# Patient Record
Sex: Male | Born: 1965
Health system: Southern US, Community
[De-identification: ages and names within clinical notes are randomized; demographics above are authoritative.]

## PROBLEM LIST (undated history)

## (undated) DIAGNOSIS — M549 Dorsalgia, unspecified: Secondary | ICD-10-CM

## (undated) DIAGNOSIS — F32A Depression, unspecified: Secondary | ICD-10-CM

## (undated) DIAGNOSIS — R569 Unspecified convulsions: Secondary | ICD-10-CM

## (undated) DIAGNOSIS — F329 Major depressive disorder, single episode, unspecified: Secondary | ICD-10-CM

## (undated) DIAGNOSIS — E785 Hyperlipidemia, unspecified: Secondary | ICD-10-CM

## (undated) DIAGNOSIS — I10 Essential (primary) hypertension: Secondary | ICD-10-CM

## (undated) DIAGNOSIS — E119 Type 2 diabetes mellitus without complications: Secondary | ICD-10-CM

## (undated) DIAGNOSIS — G8929 Other chronic pain: Secondary | ICD-10-CM

## (undated) HISTORY — PX: OTHER SURGICAL HISTORY: SHX169

---

## 2004-06-04 ENCOUNTER — Other Ambulatory Visit: Admission: RE | Admit: 2004-06-04 | Discharge: 2004-06-04 | Payer: Self-pay | Admitting: General Surgery

## 2005-01-03 ENCOUNTER — Emergency Department (HOSPITAL_COMMUNITY): Admission: EM | Admit: 2005-01-03 | Discharge: 2005-01-03 | Payer: Self-pay | Admitting: Emergency Medicine

## 2006-02-07 ENCOUNTER — Emergency Department (HOSPITAL_COMMUNITY): Admission: EM | Admit: 2006-02-07 | Discharge: 2006-02-07 | Payer: Self-pay | Admitting: Emergency Medicine

## 2006-04-07 ENCOUNTER — Ambulatory Visit: Payer: Self-pay | Admitting: Orthopedic Surgery

## 2008-06-19 ENCOUNTER — Emergency Department (HOSPITAL_COMMUNITY): Admission: EM | Admit: 2008-06-19 | Discharge: 2008-06-19 | Payer: Self-pay | Admitting: Emergency Medicine

## 2013-09-13 ENCOUNTER — Encounter (HOSPITAL_COMMUNITY): Payer: Self-pay | Admitting: Emergency Medicine

## 2013-09-13 ENCOUNTER — Other Ambulatory Visit: Payer: Self-pay

## 2013-09-13 ENCOUNTER — Emergency Department (HOSPITAL_COMMUNITY)
Admission: EM | Admit: 2013-09-13 | Discharge: 2013-09-14 | Disposition: A | Discharge status: Home / Self Care | Payer: Medicaid Other | Attending: Emergency Medicine | Admitting: Emergency Medicine

## 2013-09-13 DIAGNOSIS — F411 Generalized anxiety disorder: Secondary | ICD-10-CM | POA: Insufficient documentation

## 2013-09-13 DIAGNOSIS — E869 Volume depletion, unspecified: Secondary | ICD-10-CM

## 2013-09-13 DIAGNOSIS — I10 Essential (primary) hypertension: Secondary | ICD-10-CM | POA: Insufficient documentation

## 2013-09-13 DIAGNOSIS — E162 Hypoglycemia, unspecified: Secondary | ICD-10-CM

## 2013-09-13 DIAGNOSIS — R5381 Other malaise: Secondary | ICD-10-CM | POA: Insufficient documentation

## 2013-09-13 DIAGNOSIS — R000 Tachycardia, unspecified: Secondary | ICD-10-CM | POA: Insufficient documentation

## 2013-09-13 DIAGNOSIS — Z79899 Other long term (current) drug therapy: Secondary | ICD-10-CM | POA: Insufficient documentation

## 2013-09-13 DIAGNOSIS — E1169 Type 2 diabetes mellitus with other specified complication: Secondary | ICD-10-CM | POA: Insufficient documentation

## 2013-09-13 DIAGNOSIS — E785 Hyperlipidemia, unspecified: Secondary | ICD-10-CM | POA: Insufficient documentation

## 2013-09-13 DIAGNOSIS — G8929 Other chronic pain: Secondary | ICD-10-CM | POA: Insufficient documentation

## 2013-09-13 DIAGNOSIS — R42 Dizziness and giddiness: Secondary | ICD-10-CM | POA: Insufficient documentation

## 2013-09-13 DIAGNOSIS — Z7982 Long term (current) use of aspirin: Secondary | ICD-10-CM | POA: Insufficient documentation

## 2013-09-13 HISTORY — DX: Hyperlipidemia, unspecified: E78.5

## 2013-09-13 HISTORY — DX: Other chronic pain: G89.29

## 2013-09-13 HISTORY — DX: Essential (primary) hypertension: I10

## 2013-09-13 HISTORY — DX: Type 2 diabetes mellitus without complications: E11.9

## 2013-09-13 HISTORY — DX: Dorsalgia, unspecified: M54.9

## 2013-09-13 LAB — GLUCOSE, CAPILLARY
Glucose-Capillary: 64 mg/dL — ABNORMAL LOW (ref 70–99)
Glucose-Capillary: 108 mg/dL — ABNORMAL HIGH (ref 70–99)

## 2013-09-13 MED ORDER — SODIUM CHLORIDE 0.9 % IV SOLN
Freq: Once | INTRAVENOUS | Status: AC
  Administered 2013-09-13: 23:00:00 via INTRAVENOUS

## 2013-09-13 NOTE — ED Notes (Signed)
Pt blood sugar 64, MD aware, pt alert eating peanut butter crackers and drinking juice.

## 2013-09-13 NOTE — ED Notes (Signed)
Pt glucose level at 108

## 2013-09-13 NOTE — ED Notes (Signed)
MD at the bedside  
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
## 2013-09-13 NOTE — ED Provider Notes (Signed)
CSN: [id number]     Arrival date & time 09/13/13  1845 History   First MD Initiated Contact with Patient 09/13/13 1932      This chart was scribed for Lyanne Co, MD by Arlan Organ, ED Scribe. This patient was seen in room APA06/APA06 and the patient's care was started 7:49 PM.   Chief Complaint  Patient presents with  . Hypoglycemia   The history is provided by the patient. No language interpreter was used.   HPI Comments: Dale Terrell is a 47 y.o. male with a hx of diabetes brought in by ambulance, who presents to the Emergency Department complaining of hypoglycemia that started today. Pt states he started feeling anxious which he feels led to dizziness. He states he has not eaten anything since this morning. Prior to arrival, pts blood sugar was 48. 1 amp of D50 was given, and blood sugar increased to 179 per nurses notes. Pt states at the time, he was unable to get off the ground secondary to dizziness and weakness, but currently is able to ambulate without difficulty. Pt denies any alcohol or drug use. Pt denies nausea and emesis.  Past Medical History  Diagnosis Date  . Diabetes mellitus without complication   . Chronic back pain   . Hyperlipidemia   . Hypertension    History reviewed. No pertinent past surgical history. History reviewed. No pertinent family history. History  Substance Use Topics  . Smoking status: Never Smoker   . Smokeless tobacco: Not on file  . Alcohol Use: No    Review of Systems A complete 10 system review of systems was obtained and all systems are negative except as noted in the HPI and PMH.   Allergies  Review of patient's allergies indicates no known allergies.  Home Medications   Current Outpatient Rx  Name  Route  Sig  Dispense  Refill  . aspirin EC 81 MG tablet   Oral   Take 81 mg by mouth daily.         Marland Kitchen glipiZIDE (GLUCOTROL) 5 MG tablet   Oral   Take 5 mg by mouth every morning.         Marland Kitchen HYDROcodone-acetaminophen  (NORCO/VICODIN) 5-325 MG per tablet   Oral   Take 1 tablet by mouth daily as needed. For pain         . lisinopril (PRINIVIL,ZESTRIL) 5 MG tablet   Oral   Take 5 mg by mouth daily.         . metFORMIN (GLUCOPHAGE) 500 MG tablet   Oral   Take 1,000 mg by mouth 2 (two) times daily.         . simvastatin (ZOCOR) 40 MG tablet   Oral   Take 40 mg by mouth at bedtime.           Triage Vitals: BP 105/69  Pulse 131  Temp(Src) 98.2 F (36.8 C) (Oral)  Resp 20  Ht 5\' 7"  (1.702 m)  Wt 210 lb (95.255 kg)  BMI 32.88 kg/m2  SpO2 100%  Physical Exam  Nursing note and vitals reviewed. Constitutional: He is oriented to person, place, and time. He appears well-developed and well-nourished.  HENT:  Head: Normocephalic and atraumatic.  Eyes: EOM are normal.  Neck: Normal range of motion.  Cardiovascular: Regular rhythm, normal heart sounds and intact distal pulses.   Tachy   Pulmonary/Chest: Effort normal and breath sounds normal. No respiratory distress.  Abdominal: Soft. He exhibits no distension. There is  no tenderness.  Musculoskeletal: Normal range of motion.  Neurological: He is alert and oriented to person, place, and time.  Skin: Skin is warm and dry.  Psychiatric: He has a normal mood and affect. Judgment normal.    ED Course  Procedures (including critical care time)  ECG interpretation  Date: 09/13/2013  Rate: 110  Rhythm: Sinus tachycardia  QRS Axis: normal  Intervals: normal  ST/T Wave abnormalities: normal  Conduction Disutrbances: none  Narrative Interpretation:   Old EKG Reviewed: No prior EKG available     DIAGNOSTIC STUDIES: Oxygen Saturation is 100% on RA, Normal by my interpretation.    COORDINATION OF CARE: 7:50 PM-Discussed treatment plan with pt at bedside and pt agreed to plan.      Labs Review Labs Reviewed  GLUCOSE, CAPILLARY - Abnormal; Notable for the following:    Glucose-Capillary 64 (*)    All other components within normal  limits  GLUCOSE, CAPILLARY - Abnormal; Notable for the following:    Glucose-Capillary 108 (*)    All other components within normal limits  GLUCOSE, CAPILLARY   Imaging Review No results found.    MDM   1. Hypoglycemia   2. Volume depletion    Patient presents with some hypoglycemia.  This is likely because he did not eat lunch or dinner.  His had decreased oral intake.  His tachycardia sinus tachycardia appears to be related to volume depletion.  He received IV fluids and feels much better at this time.  No indication for laboratory studies.  Patient given a solid meal here in the emergency department and feels much better.  Discharge home in good condition.  I personally performed the services described in this documentation, which was scribed in my presence. The recorded information has been reviewed and is accurate.       Lyanne Co, MD 09/13/13 [phone]

## 2013-09-14 NOTE — ED Notes (Signed)
Patient given discharge instruction, verbalized understand. IV removed, band aid applied. Patient ambulatory out of the department.  

## 2013-09-19 ENCOUNTER — Encounter (HOSPITAL_COMMUNITY): Payer: Self-pay | Admitting: Emergency Medicine

## 2013-09-19 ENCOUNTER — Emergency Department (HOSPITAL_COMMUNITY): Payer: Medicare Other

## 2013-09-19 ENCOUNTER — Observation Stay (HOSPITAL_COMMUNITY)
Admission: EM | Admit: 2013-09-19 | Discharge: 2013-09-20 | Disposition: A | Discharge status: Home / Self Care | Payer: Medicare Other | Attending: Internal Medicine | Admitting: Internal Medicine

## 2013-09-19 DIAGNOSIS — E162 Hypoglycemia, unspecified: Secondary | ICD-10-CM

## 2013-09-19 DIAGNOSIS — I10 Essential (primary) hypertension: Secondary | ICD-10-CM | POA: Insufficient documentation

## 2013-09-19 DIAGNOSIS — E1169 Type 2 diabetes mellitus with other specified complication: Principal | ICD-10-CM | POA: Insufficient documentation

## 2013-09-19 DIAGNOSIS — G9340 Encephalopathy, unspecified: Secondary | ICD-10-CM

## 2013-09-19 LAB — CBC WITH DIFFERENTIAL/PLATELET
Basophils Relative: 0 % (ref 0–1)
Eosinophils Relative: 0 % (ref 0–5)
HCT: 43.7 % (ref 39.0–52.0)
Lymphocytes Relative: 12 % (ref 12–46)
Lymphs Abs: 1.6 10*3/uL (ref 0.7–4.0)
MCV: 89.5 fL (ref 78.0–100.0)
Monocytes Absolute: 1.1 10*3/uL — ABNORMAL HIGH (ref 0.1–1.0)
Monocytes Relative: 8 % (ref 3–12)
Neutro Abs: 10.7 10*3/uL — ABNORMAL HIGH (ref 1.7–7.7)
Neutrophils Relative %: 80 % — ABNORMAL HIGH (ref 43–77)
RBC: 4.88 MIL/uL (ref 4.22–5.81)
WBC: 13.3 10*3/uL — ABNORMAL HIGH (ref 4.0–10.5)

## 2013-09-19 LAB — URINALYSIS, ROUTINE W REFLEX MICROSCOPIC
Glucose, UA: NEGATIVE mg/dL
Ketones, ur: 40 mg/dL — AB
Protein, ur: 30 mg/dL — AB

## 2013-09-19 LAB — RAPID URINE DRUG SCREEN, HOSP PERFORMED
Benzodiazepines: NOT DETECTED
Cocaine: NOT DETECTED
Tetrahydrocannabinol: NOT DETECTED

## 2013-09-19 LAB — URINE MICROSCOPIC-ADD ON

## 2013-09-19 LAB — GLUCOSE, CAPILLARY
Glucose-Capillary: 88 mg/dL (ref 70–99)
Glucose-Capillary: 76 mg/dL (ref 70–99)
Glucose-Capillary: 76 mg/dL (ref 70–99)
Glucose-Capillary: 112 mg/dL — ABNORMAL HIGH (ref 70–99)
Glucose-Capillary: 93 mg/dL (ref 70–99)
Glucose-Capillary: 90 mg/dL (ref 70–99)

## 2013-09-19 LAB — BASIC METABOLIC PANEL
CO2: 24 mEq/L (ref 19–32)
Calcium: 9.6 mg/dL (ref 8.4–10.5)
Creatinine, Ser: 0.58 mg/dL (ref 0.50–1.35)
Glucose, Bld: 89 mg/dL (ref 70–99)
Potassium: 3.7 mEq/L (ref 3.5–5.1)

## 2013-09-19 LAB — ETHANOL: Alcohol, Ethyl (B): <11 mg/dL (ref 0–11)

## 2013-09-19 MED ORDER — SODIUM CHLORIDE 0.9 % IV BOLUS (SEPSIS)
1000.0000 mL | Freq: Once | INTRAVENOUS | Status: AC
  Administered 2013-09-19: 1000 mL via INTRAVENOUS

## 2013-09-19 MED ORDER — SODIUM CHLORIDE 0.9 % IV SOLN
Freq: Once | INTRAVENOUS | Status: AC
  Administered 2013-09-19: 18:00:00 via INTRAVENOUS

## 2013-09-19 NOTE — ED Notes (Signed)
Meal tray ordered 

## 2013-09-19 NOTE — ED Notes (Signed)
Patient semi-sleep, will answer when spoken to.

## 2013-09-19 NOTE — ED Notes (Signed)
Pt still not oriented to date.  After being told it's Sunday continues to answer Friday.
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
## 2013-09-20 DIAGNOSIS — G9340 Encephalopathy, unspecified: Secondary | ICD-10-CM

## 2013-09-20 DIAGNOSIS — E162 Hypoglycemia, unspecified: Secondary | ICD-10-CM

## 2013-09-20 LAB — CBC
Platelets: 205 10*3/uL (ref 150–400)
RBC: 4.51 MIL/uL (ref 4.22–5.81)
WBC: 9.8 10*3/uL (ref 4.0–10.5)

## 2013-09-20 LAB — COMPREHENSIVE METABOLIC PANEL
ALT: 22 U/L (ref 0–53)
AST: 32 U/L (ref 0–37)
Albumin: 3.3 g/dL — ABNORMAL LOW (ref 3.5–5.2)
CO2: 27 mEq/L (ref 19–32)
Calcium: 9.1 mg/dL (ref 8.4–10.5)
Chloride: 101 mEq/L (ref 96–112)
GFR calc non Af Amer: >90 mL/min (ref >90)
Sodium: 137 mEq/L (ref 135–145)
Total Bilirubin: 0.5 mg/dL (ref 0.3–1.2)

## 2013-09-20 LAB — GLUCOSE, CAPILLARY
Glucose-Capillary: 102 mg/dL — ABNORMAL HIGH (ref 70–99)
Glucose-Capillary: 131 mg/dL — ABNORMAL HIGH (ref 70–99)
Glucose-Capillary: 149 mg/dL — ABNORMAL HIGH (ref 70–99)
Glucose-Capillary: 60 mg/dL — ABNORMAL LOW (ref 70–99)
Glucose-Capillary: 64 mg/dL — ABNORMAL LOW (ref 70–99)
Glucose-Capillary: 70 mg/dL (ref 70–99)

## 2013-09-20 LAB — TROPONIN I: Troponin I: <0.3 ng/mL (ref <0.30)

## 2013-09-20 MED ORDER — SIMVASTATIN 20 MG PO TABS: 40.0000 mg | ORAL_TABLET | Freq: Every day | ORAL | Status: DC

## 2013-09-20 MED ORDER — DEXTROSE-NACL 5-0.45 % IV SOLN
INTRAVENOUS | Status: DC
  Administered 2013-09-20: 1000 mL via INTRAVENOUS

## 2013-09-20 MED ORDER — LISINOPRIL 5 MG PO TABS
5.0000 mg | ORAL_TABLET | Freq: Every day | ORAL | Status: DC
  Administered 2013-09-20: 5 mg via ORAL
  Filled 2013-09-20: qty 1

## 2013-09-20 MED ORDER — ENOXAPARIN SODIUM 40 MG/0.4ML ~~LOC~~ SOLN: 40.0000 mg | SUBCUTANEOUS | Status: DC

## 2013-09-20 MED ORDER — ASPIRIN EC 81 MG PO TBEC
81.0000 mg | DELAYED_RELEASE_TABLET | Freq: Every day | ORAL | Status: DC
  Administered 2013-09-20: 81 mg via ORAL
  Filled 2013-09-20: qty 1

## 2013-09-20 MED ORDER — DEXTROSE 50 % IV SOLN
25.0000 mL | Freq: Once | INTRAVENOUS | Status: AC | PRN
  Administered 2013-09-20: 25 mL via INTRAVENOUS

## 2013-09-20 MED ORDER — DEXTROSE 50 % IV SOLN
INTRAVENOUS | Status: AC
  Filled 2013-09-20: qty 50

## 2013-09-20 NOTE — Progress Notes (Signed)
UR completed 

## 2013-09-20 NOTE — Discharge Summary (Signed)
Physician Discharge Summary  Dale Terrell:[id number] DOB: 1966-02-05 DOA: 09/19/2013  PCP: No primary provider on file.  Admit date: 09/19/2013 Discharge date: 09/20/2013  Time spent: 30 minutes  Recommendations for Outpatient Follow-up:  1. Please follow up on patient's blood sugars, I discontinued his oral hypoglycemics.   Discharge Diagnoses:  Active Problems:   Hypoglycemia   Discharge Condition: Stable/improved  Diet recommendation: Diabetic diet  Filed Weights   09/19/13 2100  Weight: 98.7 kg (217 lb 9.5 oz)    History of present illness:  47 year old male who was brought to the ED by the EMS after his girlfriend called ambulance. Patient became agitated and aggressive which was unusual for him, CBG was 44 en route per Ems. Patient usually takes glipizide and metformin at home. He remembered taking this medication today but did not eat food as the food was not available as per patient. In the ED patient was given D50 and his blood sugar is still continuing to drop so [facility] were called for admission for observation.  Hospital Course:  Patient is a 47 year old gentleman with a past medical history of type 2 diabetes mellitus who had been on glipizide and metformin at home. He was brought to the emergency department by EMS as his significant other noted mental status changes. He was found to have a blood sugar of 44 by EMS. Patient was administered D. 50, placed in overnight observation for monitoring of blood sugars. Oral hypoglycemics have been discontinued. His blood sugars overnight and this morning have remained stable, with most recent blood sugar of 131. I asked patient to stop metformin and glipizide, check his blood sugars 3 times a day before meals and report these values to his family physician at which point he would receive further instructions on his diabetic regimen.    Discharge Exam: Filed Vitals:   09/20/13 0654  BP: 133/73  Pulse: 104   Temp: 99.2 F (37.3 C)  Resp: 18    General: Patient states feeling better, tolerated his breakfast. He does not appear to have acute encephalopathy, awake alert oriented x3 Cardiovascular: Regular rate and rhythm normal S1-S2 Respiratory: Lungs overall clear to auscultation bilaterally no wheezing rhonchi or rales\ Abdomen: Soft nontender nondistended  Discharge Instructions  Discharge Orders   Future Orders Complete By Expires   Call MD for:  extreme fatigue  As directed    Call MD for:  persistant dizziness or light-headedness  As directed    Call MD for:  persistant nausea and vomiting  As directed    Call MD for:  temperature >100.4  As directed    Diet - low sodium heart healthy  As directed    Discharge instructions  As directed    Comments:     Please follow up with your Family Physician in 3-4 days.  Check your blood sugars three times daily prior to meals, record theses on a log book and report these blood sugar values to your Family Doctor.   Stop taking your diabetic pills.   Increase activity slowly  As directed        Medication List    STOP taking these medications       glipiZIDE 5 MG tablet  Commonly known as:  GLUCOTROL     metFORMIN 500 MG tablet  Commonly known as:  GLUCOPHAGE      TAKE these medications       aspirin EC 81 MG tablet  Take 81 mg by mouth daily.  HYDROcodone-acetaminophen 5-325 MG per tablet  Commonly known as:  NORCO/VICODIN  Take 1 tablet by mouth daily as needed. For pain     lisinopril 5 MG tablet  Commonly known as:  PRINIVIL,ZESTRIL  Take 5 mg by mouth daily.     simvastatin 40 MG tablet  Commonly known as:  ZOCOR  Take 40 mg by mouth at bedtime.       No Known Allergies    The results of significant diagnostics from this hospitalization (including imaging, microbiology, ancillary and laboratory) are listed below for reference.    Significant Diagnostic Studies: Ct Head Wo Contrast  09/19/2013    CLINICAL DATA:  Low blood sugar, altered level of consciousness  EXAM: CT HEAD WITHOUT CONTRAST  TECHNIQUE: Contiguous axial images were obtained from the base of the skull through the vertex without intravenous contrast.  COMPARISON:  None.  FINDINGS: No mass lesion. No midline shift. No acute hemorrhage or hematoma. No extra-axial fluid collections. No evidence of acute infarction. Calvarium is intact  IMPRESSION: Negative   Electronically Signed   By: Esperanza Heir M.D.   On: 09/19/2013 18:12    Microbiology: No results found for this or any previous visit (from the past 240 hour(s)).   Labs: Basic Metabolic Panel:  Recent Labs Lab 09/19/13 1500 09/20/13 0421  NA 131* 137  K 3.7 3.9  CL 94* 101  CO2 24 27  GLUCOSE 89 75  BUN 11 7  CREATININE 0.58 0.75  CALCIUM 9.6 9.1   Liver Function Tests:  Recent Labs Lab 09/20/13 0421  AST 32  ALT 22  ALKPHOS 45  BILITOT 0.5  PROT 6.8  ALBUMIN 3.3*   No results found for this basename: LIPASE, AMYLASE,  in the last 168 hours No results found for this basename: AMMONIA,  in the last 168 hours CBC:  Recent Labs Lab 09/19/13 1500 09/20/13 0421  WBC 13.3* 9.8  NEUTROABS 10.7*  --   HGB 15.2 13.9  HCT 43.7 40.8  MCV 89.5 90.5  PLT 235 205   Cardiac Enzymes:  Recent Labs Lab 09/20/13 0421 09/20/13 1008  TROPONINI <0.30 <0.30   BNP: BNP (last 3 results) No results found for this basename: PROBNP,  in the last 8760 hours CBG:  Recent Labs Lab 09/20/13 0526 09/20/13 0630 09/20/13 0725 09/20/13 0844 09/20/13 1035  GLUCAP 89 102* 138* 117* 131*       Signed:  Oluwasemilore Bahl  [facility] 09/20/2013, 12:37 PM

## 2013-09-20 NOTE — H&P (Signed)
PCP:   No primary provider on file.   Chief Complaint:  Hypoglycemia  HPI: 47 year old male who was brought to the ED by the EMS after his girlfriend called ambulance. Patient became agitated and aggressive which was unusual for him, CBG was 44 en  route per [facility]. Patient usually takes glipizide and metformin at home. He remembered taking this medication today but did not eat food as the food was not available as per patient. In the ED patient was given D50 and his blood sugar is still continuing to drop so [facility] hospitalist were called for admission for observation.  Allergies:  No Known Allergies    Past Medical History  Diagnosis Date  . Diabetes mellitus without complication   . Chronic back pain   . Hyperlipidemia   . Hypertension     History reviewed. No pertinent past surgical history.  Prior to Admission medications   Medication Sig Start Date End Date Taking? Authorizing Provider  aspirin EC 81 MG tablet Take 81 mg by mouth daily.   Yes Historical Provider, MD  glipiZIDE (GLUCOTROL) 5 MG tablet Take 5 mg by mouth every morning. 08/30/13  Yes Historical Provider, MD  HYDROcodone-acetaminophen (NORCO/VICODIN) 5-325 MG per tablet Take 1 tablet by mouth daily as needed. For pain 08/23/13  Yes Historical Provider, MD  lisinopril (PRINIVIL,ZESTRIL) 5 MG tablet Take 5 mg by mouth daily. 08/30/13  Yes Historical Provider, MD  metFORMIN (GLUCOPHAGE) 500 MG tablet Take 1,000 mg by mouth 2 (two) times daily. 08/31/13  Yes Historical Provider, MD  simvastatin (ZOCOR) 40 MG tablet Take 40 mg by mouth at bedtime. 08/30/13  Yes Historical Provider, MD    Social History:  reports that he has never smoked. He does not have any smokeless tobacco history on file. He reports that he does not drink alcohol or use illicit drugs.  History reviewed. No pertinent family history.   All the positives are listed in BOLD  Review of Systems:  HEENT: Headache, blurred vision, runny nose, sore  throat Neck: Hypothyroidism, hyperthyroidism,,lymphadenopathy Chest : Shortness of breath, history of COPD, Asthma Heart : Chest pain, history of coronary arterey disease GI:  Nausea, vomiting, diarrhea, constipation, GERD GU: Dysuria, urgency, frequency of urination, hematuria Neuro: Stroke, seizures, syncope Psych: Depression, anxiety, hallucinations   Physical Exam: Blood pressure 145/82, pulse 120, temperature 98.5 F (36.9 C), temperature source Oral, resp. rate 18, weight 98.7 kg (217 lb 9.5 oz), SpO2 100.00%. Constitutional:   Patient is a well-developed and well-nourished male who is somnolent but able to answer all the questions. Head: Normocephalic and atraumatic Mouth: Mucus membranes moist Eyes: PERRL, EOMI, conjunctivae normal Neck: Supple, No Thyromegaly Cardiovascular: RRR, S1 normal, S2 normal Pulmonary/Chest: CTAB, no wheezes, rales, or rhonchi Abdominal: Soft. Non-tender, non-distended, bowel sounds are normal, no masses, organomegaly, or guarding present.  Neurological: A&O x3, Strenght is normal and symmetric bilaterally, cranial nerve II-XII are grossly intact, no focal motor deficit, sensory intact to light touch bilaterally.  Extremities : No Cyanosis, Clubbing or Edema   Labs on Admission:  Results for orders placed during the hospital encounter of 09/19/13 (from the past 48 hour(s))  CBC WITH DIFFERENTIAL     Status: Abnormal   Collection Time    09/19/13  3:00 PM      Result Value Range   WBC 13.3 (*) 4.0 - 10.5 K/uL   RBC 4.88  4.22 - 5.81 MIL/uL   Hemoglobin 15.2  13.0 - 17.0 g/dL   HCT 40.9  81.1 -  52.0 %   MCV 89.5  78.0 - 100.0 fL   MCH 31.1  26.0 - 34.0 pg   MCHC 34.8  30.0 - 36.0 g/dL   RDW 16.1  09.6 - 04.5 %   Platelets 235  150 - 400 K/uL   Neutrophils Relative % 80 (*) 43 - 77 %   Neutro Abs 10.7 (*) 1.7 - 7.7 K/uL   Lymphocytes Relative 12  12 - 46 %   Lymphs Abs 1.6  0.7 - 4.0 K/uL   Monocytes Relative 8  3 - 12 %   Monocytes  Absolute 1.1 (*) 0.1 - 1.0 K/uL   Eosinophils Relative 0  0 - 5 %   Eosinophils Absolute 0.0  0.0 - 0.7 K/uL   Basophils Relative 0  0 - 1 %   Basophils Absolute 0.0  0.0 - 0.1 K/uL  BASIC METABOLIC PANEL     Status: Abnormal   Collection Time    09/19/13  3:00 PM      Result Value Range   Sodium 131 (*) 135 - 145 mEq/L   Potassium 3.7  3.5 - 5.1 mEq/L   Chloride 94 (*) 96 - 112 mEq/L   CO2 24  19 - 32 mEq/L   Glucose, Bld 89  70 - 99 mg/dL   BUN 11  6 - 23 mg/dL   Creatinine, Ser 4.09  0.50 - 1.35 mg/dL   Calcium 9.6  8.4 - 81.1 mg/dL   GFR calc non Af Amer >90  >90 mL/min   GFR calc Af Amer >90  >90 mL/min   Comment: (NOTE)     The eGFR has been calculated using the CKD EPI equation.     This calculation has not been validated in all clinical situations.     eGFR's persistently <90 mL/min signify possible Chronic Kidney     Disease.  ETHANOL     Status: None   Collection Time    09/19/13  3:00 PM      Result Value Range   Alcohol, Ethyl (B) <11  0 - 11 mg/dL   Comment:            LOWEST DETECTABLE LIMIT FOR     SERUM ALCOHOL IS 11 mg/dL     FOR MEDICAL PURPOSES ONLY     DELTA CHECK NOTED  GLUCOSE, CAPILLARY     Status: None   Collection Time    09/19/13  3:56 PM      Result Value Range   Glucose-Capillary 72  70 - 99 mg/dL  URINALYSIS, ROUTINE W REFLEX MICROSCOPIC     Status: Abnormal   Collection Time    09/19/13  4:09 PM      Result Value Range   Color, Urine AMBER (*) YELLOW   Comment: BIOCHEMICALS MAY BE AFFECTED BY COLOR   APPearance CLEAR  CLEAR   Specific Gravity, Urine >1.030 (*) 1.005 - 1.030   pH 6.0  5.0 - 8.0   Glucose, UA NEGATIVE  NEGATIVE mg/dL   Hgb urine dipstick TRACE (*) NEGATIVE   Bilirubin Urine SMALL (*) NEGATIVE   Ketones, ur 40 (*) NEGATIVE mg/dL   Protein, ur 30 (*) NEGATIVE mg/dL   Urobilinogen, UA 1.0  0.0 - 1.0 mg/dL   Nitrite NEGATIVE  NEGATIVE   Leukocytes, UA NEGATIVE  NEGATIVE  URINE RAPID DRUG SCREEN (HOSP PERFORMED)      Status: None   Collection Time    09/19/13  4:09 PM  Result Value Range   Opiates NONE DETECTED  NONE DETECTED   Cocaine NONE DETECTED  NONE DETECTED   Benzodiazepines NONE DETECTED  NONE DETECTED   Amphetamines NONE DETECTED  NONE DETECTED   Tetrahydrocannabinol NONE DETECTED  NONE DETECTED   Barbiturates NONE DETECTED  NONE DETECTED   Comment:            DRUG SCREEN FOR MEDICAL PURPOSES     ONLY.  IF CONFIRMATION IS NEEDED     FOR ANY PURPOSE, NOTIFY LAB     WITHIN 5 DAYS.                LOWEST DETECTABLE LIMITS     FOR URINE DRUG SCREEN     Drug Class       Cutoff (ng/mL)     Amphetamine      1000     Barbiturate      200     Benzodiazepine   200     Tricyclics       300     Opiates          300     Cocaine          300     THC              50  URINE MICROSCOPIC-ADD ON     Status: None   Collection Time    09/19/13  4:09 PM      Result Value Range   WBC, UA 0-2  <3 WBC/hpf   RBC / HPF 0-2  <3 RBC/hpf   Urine-Other SPERM PRESENT    GLUCOSE, CAPILLARY     Status: None   Collection Time    09/19/13  4:22 PM      Result Value Range   Glucose-Capillary 88  70 - 99 mg/dL  GLUCOSE, CAPILLARY     Status: None   Collection Time    09/19/13  5:35 PM      Result Value Range   Glucose-Capillary 76  70 - 99 mg/dL  GLUCOSE, CAPILLARY     Status: None   Collection Time    09/19/13  6:42 PM      Result Value Range   Glucose-Capillary 76  70 - 99 mg/dL  GLUCOSE, CAPILLARY     Status: Abnormal   Collection Time    09/19/13  7:50 PM      Result Value Range   Glucose-Capillary 112 (*) 70 - 99 mg/dL   Comment 1 Documented in Chart     Comment 2 Notify RN    GLUCOSE, CAPILLARY     Status: None   Collection Time    09/19/13  8:53 PM      Result Value Range   Glucose-Capillary 93  70 - 99 mg/dL  GLUCOSE, CAPILLARY     Status: None   Collection Time    09/19/13 10:03 PM      Result Value Range   Glucose-Capillary 90  70 - 99 mg/dL  GLUCOSE, CAPILLARY     Status: None    Collection Time    09/20/13 12:22 AM      Result Value Range   Glucose-Capillary 70  70 - 99 mg/dL  GLUCOSE, CAPILLARY     Status: Abnormal   Collection Time    09/20/13  1:05 AM      Result Value Range   Glucose-Capillary 60 (*) 70 - 99 mg/dL  GLUCOSE, CAPILLARY     Status:  Abnormal   Collection Time    09/20/13  1:35 AM      Result Value Range   Glucose-Capillary 124 (*) 70 - 99 mg/dL    Radiological Exams on Admission: Ct Head Wo Contrast  09/19/2013   CLINICAL DATA:  Low blood sugar, altered level of consciousness  EXAM: CT HEAD WITHOUT CONTRAST  TECHNIQUE: Contiguous axial images were obtained from the base of the skull through the vertex without intravenous contrast.  COMPARISON:  None.  FINDINGS: No mass lesion. No midline shift. No acute hemorrhage or hematoma. No extra-axial fluid collections. No evidence of acute infarction. Calvarium is intact  IMPRESSION: Negative   Electronically Signed   By: Esperanza Heir M.D.   On: 09/19/2013 18:12    Assessment/Plan Active Problems:   Hypoglycemia  somnolence  47 year old male with a history of diabetes mellitus who has been on glipizide and metformin for many years was brought to the hospital with hypoglycemia likely due to non availability of food at home. Patient blood glucose continues to drop. We'll start him on D5 half normal saline at 75 mL per hour. His blood sugar will be checked every hour. D50 ampule when necessary for severe hypoglycemia. We'll start her on sliding-scale insulin and hold glipizide and metformin at this time. We'll also check hemoglobin A1c.  Code status: Presumed full code     Time Spent on Admission: 60 min  Demarrius Guerrero S [facility] Pager: [phone] 09/20/2013, 2:00 AM  If 7PM-7AM, please contact night-coverage  [email or web address]  Password [email or web address]

## 2014-10-18 DIAGNOSIS — G8929 Other chronic pain: Secondary | ICD-10-CM | POA: Insufficient documentation

## 2014-10-18 DIAGNOSIS — E1165 Type 2 diabetes mellitus with hyperglycemia: Secondary | ICD-10-CM | POA: Insufficient documentation

## 2014-10-18 DIAGNOSIS — M5440 Lumbago with sciatica, unspecified side: Secondary | ICD-10-CM | POA: Insufficient documentation

## 2014-10-18 DIAGNOSIS — E119 Type 2 diabetes mellitus without complications: Secondary | ICD-10-CM | POA: Insufficient documentation

## 2015-04-20 DIAGNOSIS — G8929 Other chronic pain: Secondary | ICD-10-CM | POA: Insufficient documentation

## 2015-04-20 DIAGNOSIS — M25512 Pain in left shoulder: Secondary | ICD-10-CM | POA: Insufficient documentation

## 2015-05-08 ENCOUNTER — Encounter (HOSPITAL_COMMUNITY): Payer: Self-pay | Admitting: Emergency Medicine

## 2015-05-08 ENCOUNTER — Emergency Department (HOSPITAL_COMMUNITY)
Admission: EM | Admit: 2015-05-08 | Discharge: 2015-05-08 | Disposition: A | Discharge status: Home / Self Care | Payer: Medicare Other | Attending: Emergency Medicine | Admitting: Emergency Medicine

## 2015-05-08 DIAGNOSIS — G8929 Other chronic pain: Secondary | ICD-10-CM | POA: Insufficient documentation

## 2015-05-08 DIAGNOSIS — E785 Hyperlipidemia, unspecified: Secondary | ICD-10-CM | POA: Diagnosis not present

## 2015-05-08 DIAGNOSIS — Y9389 Activity, other specified: Secondary | ICD-10-CM | POA: Insufficient documentation

## 2015-05-08 DIAGNOSIS — Z79899 Other long term (current) drug therapy: Secondary | ICD-10-CM | POA: Diagnosis not present

## 2015-05-08 DIAGNOSIS — S3992XA Unspecified injury of lower back, initial encounter: Secondary | ICD-10-CM | POA: Diagnosis present

## 2015-05-08 DIAGNOSIS — S39012A Strain of muscle, fascia and tendon of lower back, initial encounter: Secondary | ICD-10-CM | POA: Diagnosis not present

## 2015-05-08 DIAGNOSIS — Y92410 Unspecified street and highway as the place of occurrence of the external cause: Secondary | ICD-10-CM | POA: Insufficient documentation

## 2015-05-08 DIAGNOSIS — Y998 Other external cause status: Secondary | ICD-10-CM | POA: Diagnosis not present

## 2015-05-08 DIAGNOSIS — I10 Essential (primary) hypertension: Secondary | ICD-10-CM | POA: Diagnosis not present

## 2015-05-08 DIAGNOSIS — Z7982 Long term (current) use of aspirin: Secondary | ICD-10-CM | POA: Insufficient documentation

## 2015-05-08 DIAGNOSIS — E119 Type 2 diabetes mellitus without complications: Secondary | ICD-10-CM | POA: Insufficient documentation

## 2015-05-08 MED ORDER — CYCLOBENZAPRINE HCL 10 MG PO TABS
10.0000 mg | ORAL_TABLET | Freq: Once | ORAL | Status: AC
  Administered 2015-05-08: 10 mg via ORAL
  Filled 2015-05-08: qty 1

## 2015-05-08 MED ORDER — IBUPROFEN 800 MG PO TABS: 800.0000 mg | ORAL_TABLET | Freq: Three times a day (TID) | ORAL | Status: DC

## 2015-05-08 MED ORDER — CYCLOBENZAPRINE HCL 5 MG PO TABS: 5.0000 mg | ORAL_TABLET | Freq: Three times a day (TID) | ORAL | Status: DC | PRN

## 2015-05-08 MED ORDER — IBUPROFEN 800 MG PO TABS
800.0000 mg | ORAL_TABLET | Freq: Once | ORAL | Status: AC
  Administered 2015-05-08: 800 mg via ORAL
  Filled 2015-05-08: qty 1
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
## 2015-05-15 ENCOUNTER — Ambulatory Visit: Payer: Medicare Other | Admitting: Orthopedic Surgery

## 2015-06-12 ENCOUNTER — Ambulatory Visit: Payer: Medicare Other | Admitting: Orthopedic Surgery

## 2015-07-13 ENCOUNTER — Ambulatory Visit (INDEPENDENT_AMBULATORY_CARE_PROVIDER_SITE_OTHER): Payer: Medicare Other | Admitting: Orthopedic Surgery

## 2015-07-13 VITALS — BP 112/84 | Ht 68.0 in | Wt 212.8 lb

## 2015-07-13 DIAGNOSIS — M7552 Bursitis of left shoulder: Secondary | ICD-10-CM

## 2015-07-13 MED ORDER — HYDROCODONE-ACETAMINOPHEN 5-325 MG PO TABS: 1.0000 | ORAL_TABLET | Freq: Four times a day (QID) | ORAL | Status: DC | PRN

## 2015-07-13 NOTE — Patient Instructions (Signed)
Joint Injection  Care After  Refer to this sheet in the next few days. These instructions provide you with information on caring for yourself after you have had a joint injection. Your caregiver also may give you more specific instructions. Your treatment has been planned according to current medical practices, but problems sometimes occur. Call your caregiver if you have any problems or questions after your procedure.  After any type of joint injection, it is not uncommon to experience:  · Soreness, swelling, or bruising around the injection site.  · Mild numbness, tingling, or weakness around the injection site caused by the numbing medicine used before or with the injection.  It also is possible to experience the following effects associated with the specific agent after injection:  · Iodine-based contrast agents:  ¨ Allergic reaction (itching, hives, widespread redness, and swelling beyond the injection site).  · Corticosteroids (These effects are rare.):  ¨ Allergic reaction.  ¨ Increased blood sugar levels (If you have diabetes and you notice that your blood sugar levels have increased, notify your caregiver).  ¨ Increased blood pressure levels.  ¨ Mood swings.  · Hyaluronic acid in the use of viscosupplementation.  ¨ Temporary heat or redness.  ¨ Temporary rash and itching.  ¨ Increased fluid accumulation in the injected joint.  These effects all should resolve within a day after your procedure.   HOME CARE INSTRUCTIONS  · Limit yourself to light activity the day of your procedure. Avoid lifting heavy objects, bending, stooping, or twisting.  · Take prescription or over-the-counter pain medication as directed by your caregiver.  · You may apply ice to your injection site to reduce pain and swelling the day of your procedure. Ice may be applied 03-04 times:  ¨ Put ice in a plastic bag.  ¨ Place a towel between your skin and the bag.  ¨ Leave the ice on for no longer than 15-20 minutes each time.  SEEK  IMMEDIATE MEDICAL CARE IF:   · Pain and swelling get worse rather than better or extend beyond the injection site.  · Numbness does not go away.  · Blood or fluid continues to leak from the injection site.  · You have chest pain.  · You have swelling of your face or tongue.  · You have trouble breathing or you become dizzy.  · You develop a fever, chills, or severe tenderness at the injection site that last longer than 1 day.  MAKE SURE YOU:  · Understand these instructions.  · Watch your condition.  · Get help right away if you are not doing well or if you get worse.  Document Released: 06/27/2011 Document Revised: 01/06/2012 Document Reviewed: 06/27/2011  [employer]® Patient Information ©2015 [employer]. This information is not intended to replace advice given to you by your health care provider. Make sure you discuss any questions you have with your health care provider.

## 2015-07-13 NOTE — Progress Notes (Signed)
Patient ID: Dale Terrell, male   DOB: Oct 05, 1966, 49 y.o.   MRN: [id number] New patient evaluation  Chief Complaint  Patient presents with  . Shoulder Pain    left shoulder pain, refer Dale Terrell is a 49 y.o. male.   HPI This 49 year old male presents with two-month history of left shoulder pain no trauma pain over the left shoulder joint. Acromial region throbbing 9 out of 10 no treatment other than ibuprofen x-rays were taken and show some chronic subacromial changes no fracture. Patient has pain radiating from the upper scapula across the shoulder joint and into the upper arm related review of systems numbness burning pain in the legs dizziness weakness lightheadedness back pain depression  Has a history of diabetes Review of Systems See hpi  Past Medical History  Diagnosis Date  . Diabetes mellitus without complication   . Chronic back pain   . Hyperlipidemia   . Hypertension     No past surgical history on file.  No family history on file.  Social History Social History  Substance Use Topics  . Smoking status: Never Smoker   . Smokeless tobacco: Not on file  . Alcohol Use: No    No Known Allergies  Current Outpatient Prescriptions  Medication Sig Dispense Refill  . aspirin EC 81 MG tablet Take 81 mg by mouth daily.    . cyclobenzaprine (FLEXERIL) 5 MG tablet Take 1 tablet (5 mg total) by mouth 3 (three) times daily as needed for muscle spasms. 30 tablet 0  . HYDROcodone-acetaminophen (NORCO/VICODIN) 5-325 MG per tablet Take 1 tablet by mouth daily as needed. For pain    . HYDROcodone-acetaminophen (NORCO/VICODIN) 5-325 MG per tablet Take 1 tablet by mouth every 6 (six) hours as needed for moderate pain. 56 tablet 0  . ibuprofen (ADVIL,MOTRIN) 800 MG tablet Take 1 tablet (800 mg total) by mouth 3 (three) times daily. 21 tablet 0  . lisinopril (PRINIVIL,ZESTRIL) 5 MG tablet Take 5 mg by mouth daily.    . simvastatin (ZOCOR) 40 MG tablet Take 40 mg  by mouth at bedtime.     No current facility-administered medications for this visit.       Physical Exam Blood pressure 112/84, height 5\' 8"  (1.727 m), weight 212 lb 12.8 oz (96.525 kg). Physical Exam The patient is well developed well nourished and well groomed. Orientation to person place and time is normal  Mood is pleasant. Ambulatory status is normal without a limp Right shoulder normal range of motion motor exams intact shoulder stable neurovascular exam normal skin intact  Left shoulder positive impingement sign normal rotator cuff strength no instability mild tenderness around the acromial area posteriorly and anteriorly Skin remains intact without laceration ulceration or erythema Gross motor exam is intact without atrophy. Muscle tone normal grade 5 motor strength Neurovascular exam remains intact  Data Reviewed He brought an x-ray internal/external rotation left shoulder I do see some sclerotic changes of the subacromial region  Assessment Bursitis left shoulder Plan Subacromial injection  Procedure note the subacromial injection shoulder left   Verbal consent was obtained to inject the  Left   Shoulder  Timeout was completed to confirm the injection site is a subacromial space of the  left  shoulder  Medication used Depo-Medrol 40 mg and lidocaine 1% 3 cc  Anesthesia was provided by ethyl chloride  The injection was performed in the left  posterior subacromial space. After pinning the skin with alcohol and  anesthetized the skin with ethyl chloride the subacromial space was injected using a 20-gauge needle. There were no complications  Sterile dressing was applied.

## 2016-01-24 ENCOUNTER — Encounter (HOSPITAL_COMMUNITY): Payer: Self-pay | Admitting: *Deleted

## 2016-01-24 ENCOUNTER — Emergency Department (HOSPITAL_COMMUNITY): Payer: Medicare Other

## 2016-01-24 ENCOUNTER — Emergency Department (HOSPITAL_COMMUNITY)
Admission: EM | Admit: 2016-01-24 | Discharge: 2016-01-24 | Disposition: A | Discharge status: Home / Self Care | Payer: Medicare Other | Attending: Emergency Medicine | Admitting: Emergency Medicine

## 2016-01-24 DIAGNOSIS — E119 Type 2 diabetes mellitus without complications: Secondary | ICD-10-CM | POA: Diagnosis not present

## 2016-01-24 DIAGNOSIS — H1132 Conjunctival hemorrhage, left eye: Secondary | ICD-10-CM | POA: Insufficient documentation

## 2016-01-24 DIAGNOSIS — Z791 Long term (current) use of non-steroidal anti-inflammatories (NSAID): Secondary | ICD-10-CM | POA: Insufficient documentation

## 2016-01-24 DIAGNOSIS — S20211A Contusion of right front wall of thorax, initial encounter: Secondary | ICD-10-CM | POA: Insufficient documentation

## 2016-01-24 DIAGNOSIS — E785 Hyperlipidemia, unspecified: Secondary | ICD-10-CM | POA: Insufficient documentation

## 2016-01-24 DIAGNOSIS — Y9301 Activity, walking, marching and hiking: Secondary | ICD-10-CM | POA: Diagnosis not present

## 2016-01-24 DIAGNOSIS — S52121A Displaced fracture of head of right radius, initial encounter for closed fracture: Secondary | ICD-10-CM | POA: Diagnosis not present

## 2016-01-24 DIAGNOSIS — Y999 Unspecified external cause status: Secondary | ICD-10-CM | POA: Diagnosis not present

## 2016-01-24 DIAGNOSIS — Z79899 Other long term (current) drug therapy: Secondary | ICD-10-CM | POA: Diagnosis not present

## 2016-01-24 DIAGNOSIS — W109XXA Fall (on) (from) unspecified stairs and steps, initial encounter: Secondary | ICD-10-CM | POA: Diagnosis not present

## 2016-01-24 DIAGNOSIS — I10 Essential (primary) hypertension: Secondary | ICD-10-CM | POA: Insufficient documentation

## 2016-01-24 DIAGNOSIS — Z7982 Long term (current) use of aspirin: Secondary | ICD-10-CM | POA: Insufficient documentation

## 2016-01-24 DIAGNOSIS — S59901A Unspecified injury of right elbow, initial encounter: Secondary | ICD-10-CM | POA: Diagnosis present

## 2016-01-24 DIAGNOSIS — Y929 Unspecified place or not applicable: Secondary | ICD-10-CM | POA: Insufficient documentation

## 2016-01-24 DIAGNOSIS — W19XXXA Unspecified fall, initial encounter: Secondary | ICD-10-CM

## 2016-01-24 NOTE — ED Notes (Signed)
MD at bedside. 

## 2016-01-24 NOTE — ED Provider Notes (Signed)
CSN: [id number]     Arrival date & time 01/24/16  1617 History   First MD Initiated Contact with Patient 01/24/16 1715     Chief Complaint  Patient presents with  . Fall     (Consider location/radiation/quality/duration/timing/severity/associated sxs/prior Treatment) HPI Comments: Patient is a 50 year old male with history of diabetes, high blood pressure, and chronic back pain. He presents for evaluation of a fall. He states that 2 days ago he tripped while walking down the stairs off of his back porch when he struck his head on the railing and ribs and elbow on the stairs. He is complaining of pain in these areas. There is no loss of consciousness. He denies headache. He does report some bleeding to his left eye, but denies any visual disturbances.  Patient is a 50 y.o. male presenting with fall. The history is provided by the patient.  Fall This is a new problem. The current episode started 2 days ago. The problem occurs constantly. The problem has not changed since onset.Associated symptoms include chest pain. Pertinent negatives include no shortness of breath. Nothing aggravates the symptoms. Nothing relieves the symptoms.    Past Medical History  Diagnosis Date  . Diabetes mellitus without complication ([place])   . Chronic back pain   . Hyperlipidemia   . Hypertension    History reviewed. No pertinent past surgical history. History reviewed. No pertinent family history. Social History  Substance Use Topics  . Smoking status: Never Smoker   . Smokeless tobacco: None  . Alcohol Use: No    Review of Systems  Respiratory: Negative for shortness of breath.   Cardiovascular: Positive for chest pain.  All other systems reviewed and are negative.     Allergies  Review of patient's allergies indicates no known allergies.  Home Medications   Prior to Admission medications   Medication Sig Start Date End Date Taking? Authorizing Provider  aspirin EC 81 MG tablet Take 81 mg by  mouth daily.    Historical Provider, MD  cyclobenzaprine (FLEXERIL) 5 MG tablet Take 1 tablet (5 mg total) by mouth 3 (three) times daily as needed for muscle spasms. 05/08/15   Evalee Jefferson, PA-C  HYDROcodone-acetaminophen (NORCO/VICODIN) 5-325 MG per tablet Take 1 tablet by mouth daily as needed. For pain 08/23/13   Historical Provider, MD  HYDROcodone-acetaminophen (NORCO/VICODIN) 5-325 MG per tablet Take 1 tablet by mouth every 6 (six) hours as needed for moderate pain. 07/13/15   Carole Civil, MD  ibuprofen (ADVIL,MOTRIN) 800 MG tablet Take 1 tablet (800 mg total) by mouth 3 (three) times daily. 05/08/15   Evalee Jefferson, PA-C  lisinopril (PRINIVIL,ZESTRIL) 5 MG tablet Take 5 mg by mouth daily. 08/30/13   Historical Provider, MD  simvastatin (ZOCOR) 40 MG tablet Take 40 mg by mouth at bedtime. 08/30/13   Historical Provider, MD   BP 130/84 mmHg  Pulse 104  Temp(Src) 98.4 F (36.9 C) (Oral)  Resp 20  Ht 5\' 7"  (1.702 m)  Wt 220 lb (99.791 kg)  BMI 34.45 kg/m2  SpO2 100% Physical Exam  Constitutional: He appears well-developed and well-nourished. No distress.  HENT:  Head: Normocephalic and atraumatic.  Mouth/Throat: Oropharynx is clear and moist.  Eyes: EOM are normal. Pupils are equal, round, and reactive to light.  There is a subtle conjunctival hemorrhage present to the left eye located at 9:00. The pupil is reactive. There is no hyphema or abnormality within the anterior chamber.  Neck: Normal range of motion. Neck supple.  Cardiovascular:  Normal rate, regular rhythm and normal heart sounds.   No murmur heard. Pulmonary/Chest: Effort normal and breath sounds normal. No respiratory distress. He has no wheezes. He exhibits tenderness.  Abdominal: Soft. Bowel sounds are normal. He exhibits no distension. There is no tenderness. There is no rebound.  Musculoskeletal: He exhibits no edema.  There are multiple abrasions to the right elbow. There is also swelling to the volar aspect of the  elbow and olecranon. He has pain with full extension. Distal ulnar and radial pulses are easily palpable. He is able to flex, extend, and oppose all fingers. Sensation is intact to the entire hand.  Lymphadenopathy:    He has no cervical adenopathy.  Skin: He is not diaphoretic.  Nursing note and vitals reviewed.   ED Course  Procedures (including critical care time) Labs Review Labs Reviewed - No data to display  Imaging Review No results found. I have personally reviewed and evaluated these images and lab results as part of my medical decision-making.   EKG Interpretation None      MDM   Final diagnoses:  None    X-rays reveal no evidence for a rib fracture or pneumothorax. He does have a radial head fracture on his elbow films. This will be treated with immobilization, sling, and follow-up with orthopedics. He also has a sub-conjunctiva hemorrhage with no evidence for globe rupture or other emergent eye issues.      Veryl Speak, MD 01/24/16 [phone]

## 2016-01-24 NOTE — ED Notes (Signed)
Pt reports falling down stairs a few days ago. Reports ribs and right arm being sore. Also c/o left eye pain, states something "poked" him eye during fall. Unsure of hitting head. Denies LOC.

## 2016-01-24 NOTE — Discharge Instructions (Signed)
Wear arm sling and splint as applied until followed up by orthopedics.  The contact information for Dr. Aline Brochure from orthopedic surgery has been provided in this discharge summary. Please call and arrange an appointment in the next 3-4 days.   Radial Head Fracture A radial head fracture is a break of the smaller bone (radius) in the forearm. The head of this bone is the part near the elbow. These fractures commonly happen during a fall, when you land on an outstretched arm. These fractures are more common in middle aged adults and are common with a dislocation of the elbow. SYMPTOMS   Swelling of the elbow joint and pain on the outside of the elbow.  Pain and difficulty in bending or straightening the elbow.  Pain and difficulty in turning the palm of the hand up or down with the elbow bent. DIAGNOSIS  Your caregiver may make this diagnosis by a physical exam. X-rays can confirm the type and amount of fracture. Sometimes a fracture that is not displaced cannot be seen on the original X-ray. TREATMENT  Radial head fractures are classified according to the amount of movement (displacement) of parts from the normal position.  Type 1 Fractures  Type 1 fractures are generally small fractures in which bone pieces remain together (nondisplaced fracture).  The fracture may not be seen on initial X-rays. Usually if X-rays are repeated two to three weeks later, the fracture will show up. A splint or sling is used for a few days. Gentle early motion is used to prevent the elbow from becoming stiff. It should not be done vigorously or forced as this could displace the bone pieces. Type 2 Fractures  With type 2 fractures, bone pieces are slightly displaced and larger pieces of bone are broken off.  If only a little displacement of the bone piece is present, splinting for 4 to 5 days usually works well. This is again followed with gentle active range of motion. Small fragments may be surgically  removed.  Large pieces of bone that can be put back into place will sometimes be fixed with pins or screws to hold them until the bone is healed. If this cannot be done, the fragments are removed. For older, less active people, sometimes the entire radial head is removed if the wrist is not injured. The elbow and arm will still work fine. Soft tissue, tendon, and ligament injuries are corrected at the same time. Type 3 Fractures  Type 3 fractures have multiple broken pieces of bone that cannot be fixed. Surgery is usually needed to remove the broken bits of bone and what is left of the radial head. Soft-tissue damage is repaired. Gentle early motion is used to prevent the elbow from becoming stiff. Sometimes an artificial radial head can be used to prevent deformity if the elbow is unstable. Rest, ice, elevation, immobilization, medications, and pain control are used in the early care. HOME CARE INSTRUCTIONS   Keep the injured part elevated while sitting or lying down. Keep the injury above the level of your heart (the center of the chest). This will decrease swelling and pain.  Apply ice to the injury for 15-20 minutes, 03-04 times per day while awake, for 2 days. Put the ice in a plastic bag and place a towel between the bag of ice and your cast or splint.  Move your fingers to avoid stiffness and minimize swelling.  If you have a plaster or fiberglass cast:  Do not try to scratch the  skin under the cast using sharp or pointed objects.  Check the skin around the cast every day. You may put lotion on any red or sore areas.  Keep your cast dry and clean.  If you have a plaster splint:  Wear the splint as directed.  You may loosen the elastic around the splint if your fingers become numb, tingle, or turn cold or blue.  Do not put pressure on any part of your cast or splint. It may break. Rest your cast only on a pillow for the first 24 hours until it is fully hardened.  Your cast or  splint can be protected during bathing with a plastic bag. Do not lower the cast or splint into the water.  Only take over-the-counter or prescription medicines for pain, discomfort, or fever as directed by your caregiver.  Follow all instructions for follow-up with your caregiver. This includes any orthopedic referrals, physical therapy, and rehabilitation. Any delay in obtaining necessary care could result in a delay or failure of the bones to heal or permanent elbow stiffness.  Do not overdo exercises. This could further damage your injury. SEEK IMMEDIATE MEDICAL CARE IF:   Your cast or splint gets damaged or breaks.  You have more severe pain or swelling than you did before getting the cast.  You have severe pain when stretching your fingers.  There is a bad smell, new stains, and/or pus-like (purulent) drainage coming from under the cast.  Your fingers or hand turn pale or blue, become cold, or you lose feeling.   This information is not intended to replace advice given to you by your health care provider. Make sure you discuss any questions you have with your health care provider.   Document Released: 08/05/2006 Document Revised: 11/04/2014 Document Reviewed: 04/26/2015 Elsevier Interactive Patient Education [employer].

## 2016-01-25 ENCOUNTER — Ambulatory Visit (INDEPENDENT_AMBULATORY_CARE_PROVIDER_SITE_OTHER): Payer: Medicare Other | Admitting: Orthopaedic Surgery

## 2016-01-25 ENCOUNTER — Encounter: Payer: Self-pay | Admitting: Orthopaedic Surgery

## 2016-01-25 VITALS — BP 138/72 | HR 110 | Temp 97.5°F | Ht 67.0 in | Wt 207.8 lb

## 2016-01-25 DIAGNOSIS — S52121A Displaced fracture of head of right radius, initial encounter for closed fracture: Secondary | ICD-10-CM

## 2016-01-25 MED ORDER — HYDROCODONE-ACETAMINOPHEN 5-325 MG PO TABS: 1.0000 | ORAL_TABLET | ORAL | Status: DC | PRN

## 2016-01-25 NOTE — Progress Notes (Signed)
Subjective: I broke my right elbow    Patient ID: Dale Terrell, male    DOB: 1966/09/17, 50 y.o.   MRN: [id number]  Arm Injury  The incident occurred 3 to 5 days ago. The incident occurred at home. The injury mechanism was a fall. The pain is present in the right elbow. The quality of the pain is described as aching and burning. The pain radiates to the right arm. The pain is at a severity of 4/10. The pain is moderate. The pain has been improving since the incident. Pertinent negatives include no chest pain, muscle weakness, numbness or tingling. He has tried immobilization and rest for the symptoms. The treatment provided moderate relief.   He fell off his porch three days ago and hurt his right elbow.  He was seen in the ER yesterday.  X-rays show a small fracture of the radial head nondisplaced.  He has prior degenerative changes of the elbow long standing.  He was placed in a posterior splint on the right.  I have reviewed the ER records, the X-rays and the X-ray report.  He is better with less pain.  Review of Systems  Constitutional:       He has diabetes He has hypertension He does not have COPD He does not smoke.  HENT: Negative for congestion.   Respiratory: Negative for shortness of breath.   Cardiovascular: Negative for chest pain.  Endocrine: Negative for cold intolerance.  Musculoskeletal: Positive for myalgias, back pain, joint swelling and arthralgias.  Allergic/Immunologic: Negative for environmental allergies.  Neurological: Negative for tingling and numbness.   Past Medical History  Diagnosis Date  . Diabetes mellitus without complication ([place])   . Chronic back pain   . Hyperlipidemia   . Hypertension    History reviewed. No pertinent past surgical history. Social History   Social History  . Marital Status: Single    Spouse Name: N/A  . Number of Children: N/A  . Years of Education: N/A   Occupational History  . Not on file.   Social History Main  Topics  . Smoking status: Never Smoker   . Smokeless tobacco: Not on file  . Alcohol Use: No  . Drug Use: No  . Sexual Activity: Not on file   Other Topics Concern  . Not on file   Social History Narrative   BP 138/72 mmHg  Pulse 110  Temp(Src) 97.5 F (36.4 C)  Ht 5\' 7"  (1.702 m)  Wt 207 lb 12.8 oz (94.257 kg)  BMI 32.54 kg/m2     Objective:   Physical Exam  Constitutional: He is oriented to person, place, and time. He appears well-developed and well-nourished.  HENT:  Head: Normocephalic and atraumatic.  Eyes: Conjunctivae and EOM are normal. Pupils are equal, round, and reactive to light.  Neck: Normal range of motion. Neck supple.  Cardiovascular: Normal rate, regular rhythm and intact distal pulses.   Pulmonary/Chest: Effort normal.  Abdominal: Soft.  Musculoskeletal: He exhibits tenderness (Right elbow is tender.  ROM is 5 to 100.  Supination and pronation are reduced.  NV is intact.  Abrasion over the lateral elbow.).       Right elbow: He exhibits decreased range of motion and effusion. Tenderness found. Radial head tenderness noted.       Arms: Neurological: He is alert and oriented to person, place, and time. He has normal reflexes. No cranial nerve deficit. He exhibits normal muscle tone. Coordination normal.  Skin: Skin is warm and  dry.  Psychiatric: He has a normal mood and affect. His behavior is normal. Judgment and thought content normal.      Encounter Diagnosis  Name Primary?  . Fracture of radial head, right, closed, initial encounter Yes       Assessment & Plan:  A new long arm cast was applied.  Return to cast in two weeks with x-rays at that time.  Call if any problem.

## 2016-02-08 ENCOUNTER — Ambulatory Visit: Payer: Medicare Other | Admitting: Orthopaedic Surgery

## 2016-02-08 ENCOUNTER — Encounter: Payer: Self-pay | Admitting: Orthopaedic Surgery

## 2016-02-08 ENCOUNTER — Ambulatory Visit (INDEPENDENT_AMBULATORY_CARE_PROVIDER_SITE_OTHER): Payer: Medicare Other

## 2016-02-08 VITALS — BP 120/69 | HR 113 | Temp 98.2°F | Resp 16 | Ht 67.0 in | Wt 207.0 lb

## 2016-02-08 DIAGNOSIS — S42401D Unspecified fracture of lower end of right humerus, subsequent encounter for fracture with routine healing: Secondary | ICD-10-CM

## 2016-02-09 ENCOUNTER — Encounter: Payer: Self-pay | Admitting: Orthopaedic Surgery

## 2016-02-09 NOTE — Progress Notes (Signed)
CC:  My right elbow is better.  He had the long arm cast removed.  He has some tenderness of the right elbow.  ROM is 15 to 100 just out of the cast.  I have shown him exercises to do.  Motion should increase.  Precautions discussed.  X-rays were taken, reported separately.  Encounter Diagnosis  Name Primary?  . Elbow fracture, right, with routine healing, subsequent encounter Yes   Return in two week with x-rays at that time prior to my seeing him.  Call if any problem.  Precautions discussed and weight limits on lifting discussed.

## 2016-02-22 ENCOUNTER — Ambulatory Visit: Payer: Medicare Other | Admitting: Orthopaedic Surgery

## 2016-02-22 ENCOUNTER — Ambulatory Visit (INDEPENDENT_AMBULATORY_CARE_PROVIDER_SITE_OTHER): Payer: Medicare Other

## 2016-02-22 VITALS — BP 127/65 | HR 86 | Temp 97.7°F | Ht 67.0 in | Wt 207.0 lb

## 2016-02-22 DIAGNOSIS — S42401D Unspecified fracture of lower end of right humerus, subsequent encounter for fracture with routine healing: Secondary | ICD-10-CM

## 2016-02-22 MED ORDER — HYDROCODONE-ACETAMINOPHEN 5-325 MG PO TABS: 1.0000 | ORAL_TABLET | ORAL | Status: DC | PRN

## 2016-02-22 NOTE — Progress Notes (Signed)
Patient ID: Dale Terrell, male   DOB: 09-27-1966, 50 y.o.   MRN: [id number]  CC"  My elbow is only a little sore at times  He is doing well with the right elbow.  He has minimal pain.  He has full ROM and no effusion.  NV intact.  X-rays reported separately.  Encounter Diagnosis  Name Primary?  . Elbow fracture, right, with routine healing, subsequent encounter Yes    Return in one month. X-rays then.

## 2016-03-21 ENCOUNTER — Encounter: Payer: Self-pay | Admitting: Orthopaedic Surgery

## 2016-03-21 ENCOUNTER — Ambulatory Visit (INDEPENDENT_AMBULATORY_CARE_PROVIDER_SITE_OTHER): Payer: Medicare Other

## 2016-03-21 ENCOUNTER — Ambulatory Visit: Payer: Medicare Other

## 2016-03-21 ENCOUNTER — Ambulatory Visit: Payer: Medicare Other | Admitting: Orthopaedic Surgery

## 2016-03-21 VITALS — BP 111/73 | HR 95 | Temp 97.7°F | Resp 16 | Ht 66.2 in | Wt 212.6 lb

## 2016-03-21 DIAGNOSIS — S42401D Unspecified fracture of lower end of right humerus, subsequent encounter for fracture with routine healing: Secondary | ICD-10-CM | POA: Diagnosis not present

## 2016-03-21 NOTE — Progress Notes (Signed)
CC: my elbow does not hurt  He has no pain of the right elbow.  He has full motion of the right upper extremity with no pain.  NV is intact.  Encounter Diagnosis  Name Primary?  . Elbow fracture, right, with routine healing, subsequent encounter Yes    I will discharge him.  Return as needed.

## 2016-04-18 ENCOUNTER — Ambulatory Visit (INDEPENDENT_AMBULATORY_CARE_PROVIDER_SITE_OTHER): Payer: Medicare Other | Admitting: Orthopaedic Surgery

## 2016-04-18 ENCOUNTER — Ambulatory Visit (INDEPENDENT_AMBULATORY_CARE_PROVIDER_SITE_OTHER): Payer: Medicare Other

## 2016-04-18 VITALS — BP 130/76 | HR 102 | Temp 97.7°F | Ht 66.0 in | Wt 217.0 lb

## 2016-04-18 DIAGNOSIS — M542 Cervicalgia: Secondary | ICD-10-CM | POA: Diagnosis not present

## 2016-04-18 MED ORDER — HYDROCODONE-ACETAMINOPHEN 5-325 MG PO TABS: 1.0000 | ORAL_TABLET | ORAL | Status: DC | PRN

## 2016-04-18 MED ORDER — TIZANIDINE HCL 4 MG PO TABS: ORAL_TABLET | ORAL | Status: DC

## 2016-04-18 NOTE — Progress Notes (Signed)
Patient Dale Terrell, male DOB:Oct 07, 1966, 50 y.o. [id number]  Chief Complaint  Patient presents with  . new problem    neck pain    HPI  Dale Terrell is a 50 y.o. male who began having pain in the left upper trapezius area about two weeks ago and it is getting worse.  He has a tender area.  He has some slight pain to the left shoulder.  He has no numbness, no redness.  He has tried ice, heat and rubs with no help.   HPI  Body mass index is 35.04 kg/(m^2).  ROS  Review of Systems  Constitutional:       He has diabetes He has hypertension He does not have COPD He does not smoke.  HENT: Negative for congestion.   Respiratory: Negative for shortness of breath.   Cardiovascular: Negative for chest pain.  Endocrine: Negative for cold intolerance.  Musculoskeletal: Positive for myalgias, back pain, joint swelling and arthralgias.  Allergic/Immunologic: Negative for environmental allergies.  Neurological: Negative for numbness.    Past Medical History  Diagnosis Date  . Diabetes mellitus without complication ([place])   . Chronic back pain   . Hyperlipidemia   . Hypertension     No past surgical history on file.  No family history on file.  Social History Social History  Substance Use Topics  . Smoking status: Never Smoker   . Smokeless tobacco: Not on file  . Alcohol Use: No    No Known Allergies  Current Outpatient Prescriptions  Medication Sig Dispense Refill  . aspirin EC 81 MG tablet Take 81 mg by mouth daily.    Marland Kitchen lisinopril (PRINIVIL,ZESTRIL) 5 MG tablet Take 5 mg by mouth daily.    . metFORMIN (GLUCOPHAGE) 500 MG tablet Take by mouth 2 (two) times daily with a meal.    . sertraline (ZOLOFT) 100 MG tablet Take by mouth.    . simvastatin (ZOCOR) 40 MG tablet Take 40 mg by mouth at bedtime.     No current facility-administered medications for this visit.     Physical Exam  Blood pressure 130/76, pulse 102, temperature 97.7 F (36.5 C), height  5\' 6"  (1.676 m), weight 217 lb (98.431 kg).  Constitutional: overall normal hygiene, normal nutrition, well developed, normal grooming, normal body habitus. Assistive device:none  Musculoskeletal: gait and station Limp none, muscle tone and strength are normal, no tremors or atrophy is present.  .  Neurological: coordination overall normal.  Deep tendon reflex/nerve stretch intact.  Sensation normal.  Cranial nerves II-XII intact.   Skin:   normal overall no scars, lesions, ulcers or rashes. No psoriasis.  Psychiatric: Alert and oriented x 3.  Recent memory intact, remote memory unclear.  Normal mood and affect. Well groomed.  Good eye contact.  Cardiovascular: overall no swelling, no varicosities, no edema bilaterally, normal temperatures of the legs and arms, no clubbing, cyanosis and good capillary refill.  Lymphatic: palpation is normal.  ROM of the neck is full.  He has tenderness of the upper trapezius on the left. ROM of the left shoulder is full.  NV is intact.  X-rays were done of the cervical spine, reported separately.  The patient has been educated about the nature of the problem(s) and counseled on treatment options.  The patient appeared to understand what I have discussed and is in agreement with it.  Encounter Diagnosis  Name Primary?  . Neck pain Yes    PLAN Call if any problems.  Precautions discussed.  Continue current medications.   Return to clinic 2 weeks   Begin PT.

## 2016-05-02 ENCOUNTER — Ambulatory Visit: Payer: Medicare Other | Admitting: Orthopaedic Surgery

## 2016-05-07 ENCOUNTER — Encounter: Payer: Self-pay | Admitting: Orthopaedic Surgery

## 2016-05-07 ENCOUNTER — Ambulatory Visit (INDEPENDENT_AMBULATORY_CARE_PROVIDER_SITE_OTHER): Payer: Medicare Other | Admitting: Orthopaedic Surgery

## 2016-05-07 VITALS — BP 118/72 | HR 107 | Temp 98.1°F | Ht 67.0 in | Wt 218.6 lb

## 2016-05-07 DIAGNOSIS — M542 Cervicalgia: Secondary | ICD-10-CM

## 2016-05-07 NOTE — Progress Notes (Signed)
Patient Dale Terrell, male DOB:1966/01/06, 50 y.o. [id number]  Chief Complaint  Patient presents with  . Follow-up    Neck s/p PT    HPI  Dale Terrell is a 50 y.o. male who has neck pain.  He has been to PT once and is a little better. He has pain of the left neck and trapezius still.  He has no paresthesias.  He has no redness or new trauma.  He is doing the exercises.  HPI  Body mass index is 34.23 kg/(m^2).  ROS  Review of Systems  Constitutional:       He has diabetes He has hypertension He does not have COPD He does not smoke.  HENT: Negative for congestion.   Respiratory: Negative for shortness of breath.   Cardiovascular: Negative for chest pain.  Endocrine: Negative for cold intolerance.  Musculoskeletal: Positive for myalgias, back pain, joint swelling and arthralgias.  Allergic/Immunologic: Negative for environmental allergies.  Neurological: Negative for numbness.    Past Medical History  Diagnosis Date  . Diabetes mellitus without complication ([place])   . Chronic back pain   . Hyperlipidemia   . Hypertension     History reviewed. No pertinent past surgical history.  History reviewed. No pertinent family history.  Social History Social History  Substance Use Topics  . Smoking status: Never Smoker   . Smokeless tobacco: None  . Alcohol Use: No    No Known Allergies  Current Outpatient Prescriptions  Medication Sig Dispense Refill  . aspirin EC 81 MG tablet Take 81 mg by mouth daily.    Marland Kitchen HYDROcodone-acetaminophen (NORCO/VICODIN) 5-325 MG tablet Take 1 tablet by mouth every 4 (four) hours as needed for moderate pain (Must last 15 days.Do not take and drive a car or use machinery.). 60 tablet 0  . lisinopril (PRINIVIL,ZESTRIL) 5 MG tablet Take 5 mg by mouth daily.    . metFORMIN (GLUCOPHAGE) 500 MG tablet Take by mouth 2 (two) times daily with a meal.    . sertraline (ZOLOFT) 100 MG tablet Take by mouth.    . simvastatin (ZOCOR) 40 MG  tablet Take 40 mg by mouth at bedtime.    Marland Kitchen tiZANidine (ZANAFLEX) 4 MG tablet One by mouth every 8 hours as needed for spasm 90 tablet 3   No current facility-administered medications for this visit.     Physical Exam  Blood pressure 118/72, pulse 107, temperature 98.1 F (36.7 C), height 5\' 7"  (1.702 m), weight 218 lb 9.6 oz (99.156 kg).  Constitutional: overall normal hygiene, normal nutrition, well developed, normal grooming, normal body habitus. Assistive device:none  Musculoskeletal: gait and station Limp none, muscle tone and strength are normal, no tremors or atrophy is present.  .  Neurological: coordination overall normal.  Deep tendon reflex/nerve stretch intact.  Sensation normal.  Cranial nerves II-XII intact.   Skin:   normal overall no scars, lesions, ulcers or rashes. No psoriasis.  Psychiatric: Alert and oriented x 3.  Recent memory intact, remote memory unclear.  Normal mood and affect. Well groomed.  Good eye contact.  Cardiovascular: overall no swelling, no varicosities, no edema bilaterally, normal temperatures of the legs and arms, no clubbing, cyanosis and good capillary refill.  Lymphatic: palpation is normal.  He has full motion of the neck today and the left upper trapezius is not as tight as last time and the rhomboid and levator scapula muscles on the left are not as tender.  NV intact. ROM of the shoulders is full.  The patient has been educated about the nature of the problem(s) and counseled on treatment options.  The patient appeared to understand what I have discussed and is in agreement with it.  Encounter Diagnosis  Name Primary?  . Neck pain Yes    PLAN Call if any problems.  Precautions discussed.  Continue current medications.   Return to clinic 2 weeks   Continue PT.  Electronically Signed Sanjuana Kava, MD 7/11/20173:16 PM

## 2016-05-21 ENCOUNTER — Ambulatory Visit (INDEPENDENT_AMBULATORY_CARE_PROVIDER_SITE_OTHER): Payer: Medicare Other | Admitting: Orthopaedic Surgery

## 2016-05-21 ENCOUNTER — Encounter: Payer: Self-pay | Admitting: Orthopaedic Surgery

## 2016-05-21 VITALS — BP 125/79 | HR 112 | Temp 97.7°F | Ht 67.0 in | Wt 217.8 lb

## 2016-05-21 DIAGNOSIS — M542 Cervicalgia: Secondary | ICD-10-CM

## 2016-05-21 MED ORDER — HYDROCODONE-ACETAMINOPHEN 5-325 MG PO TABS: 1.0000 | ORAL_TABLET | ORAL | 0 refills | Status: DC | PRN

## 2016-05-21 NOTE — Progress Notes (Signed)
Patient [id number] Dale Terrell, male DOB:1966/02/04, 50 y.o. [id number]  Chief Complaint  Patient presents with  . Follow-up    Neck pain    HPI  Dale Terrell is a 50 y.o. male who has neck pain.  He is much improved.  He has been doing the exercises he learned.  He has been taking his medicine.  He has no new trauma, no paresthesias.  He is sleeping well now. HPI  Body mass index is 34.11 kg/m.  ROS  Review of Systems  Constitutional:       He has diabetes He has hypertension He does not have COPD He does not smoke.  HENT: Negative for congestion.   Respiratory: Negative for shortness of breath.   Cardiovascular: Negative for chest pain.  Endocrine: Negative for cold intolerance.  Musculoskeletal: Positive for arthralgias, back pain, joint swelling and myalgias.  Allergic/Immunologic: Negative for environmental allergies.  Neurological: Negative for numbness.    Past Medical History:  Diagnosis Date  . Chronic back pain   . Diabetes mellitus without complication ([place])   . Hyperlipidemia   . Hypertension     History reviewed. No pertinent surgical history.  History reviewed. No pertinent family history.  Social History Social History  Substance Use Topics  . Smoking status: Never Smoker  . Smokeless tobacco: Never Used  . Alcohol use No    No Known Allergies  Current Outpatient Prescriptions  Medication Sig Dispense Refill  . aspirin EC 81 MG tablet Take 81 mg by mouth daily.    Marland Kitchen lisinopril (PRINIVIL,ZESTRIL) 5 MG tablet Take 5 mg by mouth daily.    . metFORMIN (GLUCOPHAGE) 500 MG tablet Take by mouth 2 (two) times daily with a meal.    . sertraline (ZOLOFT) 100 MG tablet Take by mouth.    . simvastatin (ZOCOR) 40 MG tablet Take 40 mg by mouth at bedtime.    Marland Kitchen tiZANidine (ZANAFLEX) 4 MG tablet One by mouth every 8 hours as needed for spasm 90 tablet 3  . HYDROcodone-acetaminophen (NORCO/VICODIN) 5-325 MG tablet Take 1 tablet by mouth every 4 (four) hours  as needed for moderate pain (Must last 30 days.  Do not take and drive a car or use machinery.). 120 tablet 0   No current facility-administered medications for this visit.      Physical Exam  Blood pressure 125/79, pulse (!) 112, temperature 97.7 F (36.5 C), height 5\' 7"  (1.702 m), weight 217 lb 12.8 oz (98.8 kg).  Constitutional: overall normal hygiene, normal nutrition, well developed, normal grooming, normal body habitus. Assistive device:none  Musculoskeletal: gait and station Limp none, muscle tone and strength are normal, no tremors or atrophy is present.  .  Neurological: coordination overall normal.  Deep tendon reflex/nerve stretch intact.  Sensation normal.  Cranial nerves II-XII intact.   Skin:   normal overall no scars, lesions, ulcers or rashes. No psoriasis.  Psychiatric: Alert and oriented x 3.  Recent memory intact, remote memory unclear.  Normal mood and affect. Well groomed.  Good eye contact.  Cardiovascular: overall no swelling, no varicosities, no edema bilaterally, normal temperatures of the legs and arms, no clubbing, cyanosis and good capillary refill.  Lymphatic: palpation is normal.  He has full ROM of his neck with no spasm or tightness.  He has full ROM of both shoulders and no pain.  Strength and tone are normal.  The patient has been educated about the nature of the problem(s) and counseled on treatment options.  The  patient appeared to understand what I have discussed and is in agreement with it.  Encounter Diagnosis  Name Primary?  . Neck pain Yes    PLAN Call if any problems.  Precautions discussed.  Continue current medications.   Return to clinic 1 month   Electronically Signed Sanjuana Kava, MD 7/25/20178:12 PM

## 2016-06-06 DIAGNOSIS — N402 Nodular prostate without lower urinary tract symptoms: Secondary | ICD-10-CM | POA: Insufficient documentation

## 2016-06-06 DIAGNOSIS — B351 Tinea unguium: Secondary | ICD-10-CM | POA: Insufficient documentation

## 2016-06-06 DIAGNOSIS — L602 Onychogryphosis: Secondary | ICD-10-CM | POA: Insufficient documentation

## 2016-06-18 ENCOUNTER — Ambulatory Visit: Payer: Medicare Other | Admitting: Orthopaedic Surgery

## 2017-02-08 DIAGNOSIS — R195 Other fecal abnormalities: Secondary | ICD-10-CM | POA: Insufficient documentation

## 2017-02-12 ENCOUNTER — Encounter (INDEPENDENT_AMBULATORY_CARE_PROVIDER_SITE_OTHER): Payer: Self-pay | Admitting: Internal Medicine

## 2017-02-12 ENCOUNTER — Encounter (INDEPENDENT_AMBULATORY_CARE_PROVIDER_SITE_OTHER): Payer: Self-pay

## 2017-02-27 ENCOUNTER — Ambulatory Visit (INDEPENDENT_AMBULATORY_CARE_PROVIDER_SITE_OTHER): Payer: Medicare Other | Admitting: Internal Medicine

## 2017-11-18 ENCOUNTER — Encounter: Payer: Self-pay | Admitting: Gastroenterology

## 2017-11-25 ENCOUNTER — Encounter: Payer: Self-pay | Admitting: Internal Medicine

## 2017-12-03 ENCOUNTER — Ambulatory Visit: Payer: Medicare Other

## 2018-01-08 ENCOUNTER — Ambulatory Visit: Payer: Medicare Other | Admitting: Gastroenterology

## 2018-03-18 ENCOUNTER — Ambulatory Visit: Payer: Medicare Other | Admitting: Gastroenterology

## 2018-06-18 ENCOUNTER — Ambulatory Visit: Payer: Medicare Other | Admitting: Gastroenterology

## 2018-08-04 DIAGNOSIS — E161 Other hypoglycemia: Secondary | ICD-10-CM | POA: Diagnosis not present

## 2018-08-05 DIAGNOSIS — I10 Essential (primary) hypertension: Secondary | ICD-10-CM | POA: Diagnosis not present

## 2018-08-05 DIAGNOSIS — E785 Hyperlipidemia, unspecified: Secondary | ICD-10-CM | POA: Diagnosis not present

## 2018-08-05 DIAGNOSIS — E119 Type 2 diabetes mellitus without complications: Secondary | ICD-10-CM | POA: Diagnosis not present

## 2018-08-05 DIAGNOSIS — R195 Other fecal abnormalities: Secondary | ICD-10-CM | POA: Diagnosis not present

## 2018-09-30 ENCOUNTER — Ambulatory Visit (INDEPENDENT_AMBULATORY_CARE_PROVIDER_SITE_OTHER): Payer: Medicare Other | Admitting: Gastroenterology

## 2018-09-30 ENCOUNTER — Encounter: Payer: Self-pay | Admitting: Gastroenterology

## 2018-09-30 DIAGNOSIS — R195 Other fecal abnormalities: Secondary | ICD-10-CM | POA: Insufficient documentation

## 2018-09-30 NOTE — Progress Notes (Signed)
CC'D TO PCP °

## 2018-09-30 NOTE — Progress Notes (Signed)
Primary Care Physician:  Vicenta Aly, [place] Primary Gastroenterologist:  Dr. Oneida Alar   Chief Complaint  Patient presents with  . heme+ stool    never had tcs    HPI:   Dale Terrell is a 52 y.o. male presenting today at the request of Vicenta Aly, [place], due to heme positive stool. No prior colonoscopy. Outside labs Oct 2019 Recent Hgb 15.0, platelets 292.   No blood in stool. Scant tissue hematochezia if wipes too hard. Stomach growls all the time. No abdominal pain. Will sometimes have looser stool. No significant changes in bowel habits. If eats something spicy, will have heartburn. Only diet related. No chronic GERD. No N/V. No dysphagia.   Unintentional weight loss:  Weight 212 in 2016 Weight 219 in Jan 2019 Today 179.     Past Medical History:  Diagnosis Date  . Chronic back pain   . Diabetes mellitus without complication ([place])   . Hyperlipidemia   . Hypertension     Past Surgical History:  Procedure Laterality Date  . cyst on arm      Current Outpatient Medications  Medication Sig Dispense Refill  . aspirin EC 81 MG tablet Take 81 mg by mouth daily.    Marland Kitchen glimepiride (AMARYL) 2 MG tablet Take 4 mg by mouth daily.     Marland Kitchen lisinopril (PRINIVIL,ZESTRIL) 5 MG tablet Take 5 mg by mouth daily.    . metFORMIN (GLUCOPHAGE) 500 MG tablet Take 1,000 mg by mouth 2 (two) times daily with a meal.     . sertraline (ZOLOFT) 100 MG tablet Take 100 mg by mouth daily.     . simvastatin (ZOCOR) 40 MG tablet Take 40 mg by mouth at bedtime.    Marland Kitchen tiZANidine (ZANAFLEX) 4 MG tablet One by mouth every 8 hours as needed for spasm 90 tablet 3   No current facility-administered medications for this visit.     Allergies as of 09/30/2018 - Review Complete 09/30/2018  Allergen Reaction Noted  . Bee venom  09/30/2018    Family History  Problem Relation Age of Onset  . Colon cancer Neg Hx   . Colon polyps Neg Hx     Social History   Socioeconomic History  . Marital  status: Single    Spouse name: Not on file  . Number of children: Not on file  . Years of education: Not on file  . Highest education level: Not on file  Occupational History  . Not on file  Social Needs  . Financial resource strain: Not on file  . Food insecurity:    Worry: Not on file    Inability: Not on file  . Transportation needs:    Medical: Not on file    Non-medical: Not on file  Tobacco Use  . Smoking status: Current Some Day Smoker    Types: Cigarettes  . Smokeless tobacco: Never Used  . Tobacco comment: smokes "every so often".   Substance and Sexual Activity  . Alcohol use: Not Currently    Comment: in the past  . Drug use: No  . Sexual activity: Not on file  Lifestyle  . Physical activity:    Days per week: Not on file    Minutes per session: Not on file  . Stress: Not on file  Relationships  . Social connections:    Talks on phone: Not on file    Gets together: Not on file    Attends religious service: Not on  file    Active member of club or organization: Not on file    Attends meetings of clubs or organizations: Not on file    Relationship status: Not on file  . Intimate partner violence:    Fear of current or ex partner: Not on file    Emotionally abused: Not on file    Physically abused: Not on file    Forced sexual activity: Not on file  Other Topics Concern  . Not on file  Social History Narrative  . Not on file    Review of Systems: Gen: Denies any fever, chills, fatigue, weight loss, lack of appetite.  CV: Denies chest pain, heart palpitations, peripheral edema, syncope.  Resp: Denies shortness of breath at rest or with exertion. Denies wheezing or cough.  GI: see HPI  GU : Denies urinary burning, urinary frequency, urinary hesitancy MS: Denies joint pain, muscle weakness, cramps, or limitation of movement.  Derm: Denies rash, itching, dry skin Psych: Denies depression, anxiety, memory loss, and confusion Heme: Denies bruising, bleeding,  and enlarged lymph nodes.  Physical Exam: BP 122/74   Pulse (!) 110   Temp (!) 97.4 F (36.3 C) (Oral)   Ht 5\' 7"  (1.702 m)   Wt 179 lb 6.4 oz (81.4 kg)   BMI 28.10 kg/m  General:   Alert and oriented. Pleasant and cooperative. Well-nourished and well-developed.  Head:  Normocephalic and atraumatic. Eyes:  Without icterus, sclera clear and conjunctiva pink.  Ears:  Normal auditory acuity. Nose:  No deformity, discharge,  or lesions. Mouth:  No deformity or lesions, oral mucosa pink.  Lungs:  Clear to auscultation bilaterally. No wheezes, rales, or rhonchi. No distress.  Heart:  S1, S2 present without murmurs appreciated.  Abdomen:  +BS, soft, non-tender and non-distended. No HSM noted. No guarding or rebound. No masses appreciated.  Rectal:  Deferred  Msk:  Symmetrical without gross deformities. Normal posture. Extremities:  Without  edema. Neurologic:  Alert and  oriented x 4 Skin:  Intact without significant lesions or rashes. Psych:  Alert and cooperative. Normal mood and affect.

## 2018-09-30 NOTE — Patient Instructions (Addendum)
We have arranged a colonoscopy with Dr. Oneida Alar in the near future.  Do not take metformin or glimepiride the day of the procedure.  Further recommendations to follow!  It was a pleasure to see you today. I strive to create trusting relationships with patients to provide genuine, compassionate, and quality care. I value your feedback. If you receive a survey regarding your visit,  I greatly appreciate you taking time to fill this out.   Annitta Needs, PhD, ANP-BC [facility]

## 2018-09-30 NOTE — Assessment & Plan Note (Signed)
52 year old male with history of heme positive stool, scant hematochezia only with wiping, and no prior colonoscopy. No concerning upper or lower GI symptoms; however, he has had an unintentional 40 lb weight loss since Jan 2019. No loss of appetite or abdominal pain. Pursuing diagnostic colonoscopy in near future.  Proceed with colonoscopy with Dr. Oneida Alar in the near future. The risks, benefits, and alternatives have been discussed in detail with the patient. They state understanding and desire to proceed.  Propofol due to polypharmacy No oral diabetes medication day of procedure

## 2018-10-06 ENCOUNTER — Telehealth: Payer: Self-pay | Admitting: *Deleted

## 2018-10-06 ENCOUNTER — Other Ambulatory Visit: Payer: Self-pay

## 2018-10-06 DIAGNOSIS — R195 Other fecal abnormalities: Secondary | ICD-10-CM

## 2018-10-06 MED ORDER — NA SULFATE-K SULFATE-MG SULF 17.5-3.13-1.6 GM/177ML PO SOLN: 1.0000 | ORAL | 0 refills | Status: DC

## 2018-10-06 NOTE — Telephone Encounter (Signed)
Received march schedule. Called patient, line rang numerous times, NA, no VM to schedule TCS W/ SLF W/ MAC.

## 2018-10-06 NOTE — Telephone Encounter (Signed)
Pt called office. TCS w/Propofol w/SLF scheduled for 01/05/19 at 12:00pm. Rx for prep sent to pharmacy. Orders entered.

## 2018-10-06 NOTE — Telephone Encounter (Signed)
Called and informed pt of pre-op appt 12/30/18 at 10:00am. Letter mailed with procedure instructions.

## 2018-12-23 NOTE — Patient Instructions (Signed)
Dale Terrell  12/23/2018     @PREFPERIOPPHARMACY @   Your procedure is scheduled on  01/05/2019.  Report to [facility] at  1030  A.M.  Call this number if you have problems the morning of surgery:  [phone]   Remember:  Follow the diet and prep instructions given to you by Dr Nona Dell office.                        Take these medicines the morning of surgery with A SIP OF WATER  Zoloft, zanaflex.    Do not wear jewelry, make-up or nail polish.  Do not wear lotions, powders, or perfumes, or deodorant.  Do not shave 48 hours prior to surgery.  Men may shave face and neck.  Do not bring valuables to the hospital.  [facility] is not responsible for any belongings or valuables.  Contacts, dentures or bridgework may not be worn into surgery.  Leave your suitcase in the car.  After surgery it may be brought to your room.  For patients admitted to the hospital, discharge time will be determined by your treatment team.  Patients discharged the day of surgery will not be allowed to drive home.   Name and phone number of your driver:   family Special instructions: DO NOT take any medications for diabetes the morning of your procedure.  Please read over the following fact sheets that you were given. Anesthesia Post-op Instructions and Care and Recovery After Surgery       Colonoscopy, Adult, Care After This sheet gives you information about how to care for yourself after your procedure. Your health care provider may also give you more specific instructions. If you have problems or questions, contact your health care provider. What can I expect after the procedure? After the procedure, it is common to have:  A small amount of blood in your stool for 24 hours after the procedure.  Some gas.  Mild abdominal cramping or bloating. Follow these instructions at home: General instructions  For the first 24 hours after the procedure: ? Do not drive or use machinery. ? Do  not sign important documents. ? Do not drink alcohol. ? Do your regular daily activities at a slower pace than normal. ? Eat soft, easy-to-digest foods.  Take over-the-counter or prescription medicines only as told by your health care provider. Relieving cramping and bloating   Try walking around when you have cramps or feel bloated.  Apply heat to your abdomen as told by your health care provider. Use a heat source that your health care provider recommends, such as a moist heat pack or a heating pad. ? Place a towel between your skin and the heat source. ? Leave the heat on for 20-30 minutes. ? Remove the heat if your skin turns bright red. This is especially important if you are unable to feel pain, heat, or cold. You may have a greater risk of getting burned. Eating and drinking   Drink enough fluid to keep your urine pale yellow.  Resume your normal diet as instructed by your health care provider. Avoid heavy or fried foods that are hard to digest.  Avoid drinking alcohol for as long as instructed by your health care provider. Contact a health care provider if:  You have blood in your stool 2-3 days after the procedure. Get help right away if:  You have more than a small spotting of blood  in your stool.  You pass large blood clots in your stool.  Your abdomen is swollen.  You have nausea or vomiting.  You have a fever.  You have increasing abdominal pain that is not relieved with medicine. Summary  After the procedure, it is common to have a small amount of blood in your stool. You may also have mild abdominal cramping and bloating.  For the first 24 hours after the procedure, do not drive or use machinery, sign important documents, or drink alcohol.  Contact your health care provider if you have a lot of blood in your stool, nausea or vomiting, a fever, or increased abdominal pain. This information is not intended to replace advice given to you by your health care  provider. Make sure you discuss any questions you have with your health care provider. Document Released: 05/28/2004 Document Revised: 08/06/2017 Document Reviewed: 12/26/2015 Elsevier Interactive Patient Education  2019 [place], Care After These instructions provide you with information about caring for yourself after your procedure. Your health care provider may also give you more specific instructions. Your treatment has been planned according to current medical practices, but problems sometimes occur. Call your health care provider if you have any problems or questions after your procedure. What can I expect after the procedure? After your procedure, you may:  Feel sleepy for several hours.  Feel clumsy and have poor balance for several hours.  Feel forgetful about what happened after the procedure.  Have poor judgment for several hours.  Feel nauseous or vomit.  Have a sore throat if you had a breathing tube during the procedure. Follow these instructions at home: For at least 24 hours after the procedure:      Have a responsible adult stay with you. It is important to have someone help care for you until you are awake and alert.  Rest as needed.  Do not: ? Participate in activities in which you could fall or become injured. ? Drive. ? Use heavy machinery. ? Drink alcohol. ? Take sleeping pills or medicines that cause drowsiness. ? Make important decisions or sign legal documents. ? Take care of children on your own. Eating and drinking  Follow the diet that is recommended by your health care provider.  If you vomit, drink water, juice, or soup when you can drink without vomiting.  Make sure you have little or no nausea before eating solid foods. General instructions  Take over-the-counter and prescription medicines only as told by your health care provider.  If you have sleep apnea, surgery and certain medicines can increase your  risk for breathing problems. Follow instructions from your health care provider about wearing your sleep device: ? Anytime you are sleeping, including during daytime naps. ? While taking prescription pain medicines, sleeping medicines, or medicines that make you drowsy.  If you smoke, do not smoke without supervision.  Keep all follow-up visits as told by your health care provider. This is important. Contact a health care provider if:  You keep feeling nauseous or you keep vomiting.  You feel light-headed.  You develop a rash.  You have a fever. Get help right away if:  You have trouble breathing. Summary  For several hours after your procedure, you may feel sleepy and have poor judgment.  Have a responsible adult stay with you for at least 24 hours or until you are awake and alert. This information is not intended to replace advice given to you by your health  care provider. Make sure you discuss any questions you have with your health care provider. Document Released: 02/04/2016 Document Revised: 05/30/2017 Document Reviewed: 02/04/2016 Elsevier Interactive Patient Education  2019 [employer].

## 2018-12-30 ENCOUNTER — Encounter (HOSPITAL_COMMUNITY): Payer: Self-pay

## 2018-12-30 ENCOUNTER — Encounter (HOSPITAL_COMMUNITY)
Admission: RE | Admit: 2018-12-30 | Discharge: 2018-12-30 | Disposition: A | Discharge status: Home / Self Care | Payer: Medicare Other | Source: Ambulatory Visit | Attending: Gastroenterology | Admitting: Gastroenterology

## 2018-12-30 ENCOUNTER — Other Ambulatory Visit: Payer: Self-pay

## 2018-12-30 DIAGNOSIS — Z01812 Encounter for preprocedural laboratory examination: Secondary | ICD-10-CM | POA: Diagnosis not present

## 2018-12-30 HISTORY — DX: Unspecified convulsions: R56.9

## 2018-12-30 LAB — BASIC METABOLIC PANEL
Anion gap: 11 (ref 5–15)
BUN: 12 mg/dL (ref 6–20)
CO2: 24 mmol/L (ref 22–32)
Calcium: 9.6 mg/dL (ref 8.9–10.3)
Chloride: 104 mmol/L (ref 98–111)
Creatinine, Ser: 0.72 mg/dL (ref 0.61–1.24)
GFR calc Af Amer: >60 mL/min (ref >60)
GFR calc non Af Amer: >60 mL/min (ref >60)
Glucose, Bld: 88 mg/dL (ref 70–99)
Potassium: 4.4 mmol/L (ref 3.5–5.1)
Sodium: 139 mmol/L (ref 135–145)

## 2018-12-30 NOTE — Pre-Procedure Instructions (Signed)
In for PAT. Patient states he is going to ride [employer] home after procedure. I instructed him he could do this, but he could not be by himself, he will need someone to ride [employer] with him. He states he does not have anyone who can do it this day, He will call office to reschedule procedure.

## 2019-01-05 ENCOUNTER — Ambulatory Visit (HOSPITAL_COMMUNITY)
Admission: RE | Admit: 2019-01-05 | Discharge: 2019-01-05 | Disposition: A | Discharge status: Home / Self Care | Payer: Medicare Other | Attending: Gastroenterology | Admitting: Gastroenterology

## 2019-01-05 ENCOUNTER — Ambulatory Visit (HOSPITAL_COMMUNITY): Payer: Medicare Other | Admitting: Anesthesiology

## 2019-01-05 ENCOUNTER — Encounter (HOSPITAL_COMMUNITY): Payer: Self-pay

## 2019-01-05 ENCOUNTER — Other Ambulatory Visit: Payer: Self-pay

## 2019-01-05 ENCOUNTER — Encounter (HOSPITAL_COMMUNITY)
Admission: RE | Disposition: A | Discharge status: Home / Self Care | Payer: Self-pay | Source: Home / Self Care | Attending: Gastroenterology

## 2019-01-05 DIAGNOSIS — E119 Type 2 diabetes mellitus without complications: Secondary | ICD-10-CM | POA: Insufficient documentation

## 2019-01-05 DIAGNOSIS — K2970 Gastritis, unspecified, without bleeding: Secondary | ICD-10-CM | POA: Insufficient documentation

## 2019-01-05 DIAGNOSIS — E785 Hyperlipidemia, unspecified: Secondary | ICD-10-CM | POA: Insufficient documentation

## 2019-01-05 DIAGNOSIS — K2981 Duodenitis with bleeding: Secondary | ICD-10-CM | POA: Diagnosis not present

## 2019-01-05 DIAGNOSIS — Z7984 Long term (current) use of oral hypoglycemic drugs: Secondary | ICD-10-CM | POA: Diagnosis not present

## 2019-01-05 DIAGNOSIS — D122 Benign neoplasm of ascending colon: Secondary | ICD-10-CM | POA: Diagnosis not present

## 2019-01-05 DIAGNOSIS — F329 Major depressive disorder, single episode, unspecified: Secondary | ICD-10-CM | POA: Diagnosis not present

## 2019-01-05 DIAGNOSIS — I10 Essential (primary) hypertension: Secondary | ICD-10-CM | POA: Insufficient documentation

## 2019-01-05 DIAGNOSIS — K644 Residual hemorrhoidal skin tags: Secondary | ICD-10-CM | POA: Diagnosis not present

## 2019-01-05 DIAGNOSIS — Z79899 Other long term (current) drug therapy: Secondary | ICD-10-CM | POA: Insufficient documentation

## 2019-01-05 DIAGNOSIS — R195 Other fecal abnormalities: Secondary | ICD-10-CM | POA: Diagnosis not present

## 2019-01-05 DIAGNOSIS — K6389 Other specified diseases of intestine: Secondary | ICD-10-CM | POA: Diagnosis not present

## 2019-01-05 DIAGNOSIS — K222 Esophageal obstruction: Secondary | ICD-10-CM | POA: Diagnosis not present

## 2019-01-05 DIAGNOSIS — K921 Melena: Secondary | ICD-10-CM | POA: Diagnosis present

## 2019-01-05 DIAGNOSIS — K2980 Duodenitis without bleeding: Secondary | ICD-10-CM | POA: Diagnosis not present

## 2019-01-05 DIAGNOSIS — K317 Polyp of stomach and duodenum: Secondary | ICD-10-CM | POA: Insufficient documentation

## 2019-01-05 DIAGNOSIS — R634 Abnormal weight loss: Secondary | ICD-10-CM | POA: Diagnosis not present

## 2019-01-05 DIAGNOSIS — K648 Other hemorrhoids: Secondary | ICD-10-CM | POA: Diagnosis not present

## 2019-01-05 DIAGNOSIS — H5213 Myopia, bilateral: Secondary | ICD-10-CM | POA: Diagnosis not present

## 2019-01-05 DIAGNOSIS — F17210 Nicotine dependence, cigarettes, uncomplicated: Secondary | ICD-10-CM | POA: Diagnosis not present

## 2019-01-05 DIAGNOSIS — Z7982 Long term (current) use of aspirin: Secondary | ICD-10-CM | POA: Insufficient documentation

## 2019-01-05 DIAGNOSIS — G8929 Other chronic pain: Secondary | ICD-10-CM | POA: Insufficient documentation

## 2019-01-05 DIAGNOSIS — Q438 Other specified congenital malformations of intestine: Secondary | ICD-10-CM | POA: Diagnosis not present

## 2019-01-05 DIAGNOSIS — D123 Benign neoplasm of transverse colon: Secondary | ICD-10-CM | POA: Diagnosis not present

## 2019-01-05 DIAGNOSIS — K3189 Other diseases of stomach and duodenum: Secondary | ICD-10-CM | POA: Diagnosis not present

## 2019-01-05 DIAGNOSIS — M549 Dorsalgia, unspecified: Secondary | ICD-10-CM | POA: Diagnosis not present

## 2019-01-05 HISTORY — PX: ESOPHAGOGASTRODUODENOSCOPY (EGD) WITH PROPOFOL: SHX5813

## 2019-01-05 HISTORY — PX: COLONOSCOPY WITH PROPOFOL: SHX5780

## 2019-01-05 HISTORY — DX: Depression, unspecified: F32.A

## 2019-01-05 HISTORY — DX: Major depressive disorder, single episode, unspecified: F32.9

## 2019-01-05 HISTORY — PX: POLYPECTOMY: SHX5525

## 2019-01-05 LAB — TSH: TSH: 1.547 u[IU]/mL (ref 0.350–4.500)

## 2019-01-05 LAB — GLUCOSE, CAPILLARY
Glucose-Capillary: 65 mg/dL — ABNORMAL LOW (ref 70–99)
Glucose-Capillary: 72 mg/dL (ref 70–99)

## 2019-01-05 SURGERY — COLONOSCOPY WITH PROPOFOL
Anesthesia: General

## 2019-01-05 MED ORDER — PROPOFOL 10 MG/ML IV BOLUS
INTRAVENOUS | Status: DC | PRN
  Administered 2019-01-05 (×2): 20 mg via INTRAVENOUS

## 2019-01-05 MED ORDER — MIDAZOLAM HCL 2 MG/2ML IJ SOLN: 0.5000 mg | Freq: Once | INTRAMUSCULAR | Status: DC | PRN

## 2019-01-05 MED ORDER — CHLORHEXIDINE GLUCONATE CLOTH 2 % EX PADS: 6.0000 | MEDICATED_PAD | Freq: Once | CUTANEOUS | Status: DC

## 2019-01-05 MED ORDER — HYDROCODONE-ACETAMINOPHEN 7.5-325 MG PO TABS: 1.0000 | ORAL_TABLET | Freq: Once | ORAL | Status: DC | PRN

## 2019-01-05 MED ORDER — PROPOFOL 500 MG/50ML IV EMUL
INTRAVENOUS | Status: DC | PRN
  Administered 2019-01-05: 12:00:00 via INTRAVENOUS
  Administered 2019-01-05: 125 ug/kg/min via INTRAVENOUS

## 2019-01-05 MED ORDER — PROMETHAZINE HCL 25 MG/ML IJ SOLN: 6.2500 mg | INTRAMUSCULAR | Status: DC | PRN

## 2019-01-05 MED ORDER — MIDAZOLAM HCL 2 MG/2ML IJ SOLN
INTRAMUSCULAR | Status: AC
  Filled 2019-01-05: qty 2

## 2019-01-05 MED ORDER — OMEPRAZOLE 20 MG PO CPDR: DELAYED_RELEASE_CAPSULE | ORAL | 3 refills | Status: DC

## 2019-01-05 MED ORDER — MIDAZOLAM HCL 5 MG/5ML IJ SOLN
INTRAMUSCULAR | Status: DC | PRN
  Administered 2019-01-05: 2 mg via INTRAVENOUS

## 2019-01-05 MED ORDER — LACTATED RINGERS IV SOLN
INTRAVENOUS | Status: DC
  Administered 2019-01-05 (×2): via INTRAVENOUS

## 2019-01-05 MED ORDER — HYDROMORPHONE HCL 1 MG/ML IJ SOLN: 0.2500 mg | INTRAMUSCULAR | Status: DC | PRN

## 2019-01-05 NOTE — Op Note (Addendum)
[facility] Patient Name: Dale Terrell Procedure Date: 01/05/2019 10:53 AM MRN: [id number] Date of Birth: Feb 06, 1966 Attending MD: Barney Drain MD, MD CSN: [id number] Age: 53 Admit Type: Outpatient Procedure:                Colonoscopy WITH COLD SNARE POLYPECTOMY Indications:              Heme positive stool, Weight loss Providers:                Barney Drain MD, MD, Janeece Riggers, RN, Aram Candela Referring MD:             Vicenta Aly Medicines:                Propofol per Anesthesia Complications:            No immediate complications. Estimated Blood Loss:     Estimated blood loss was minimal. Procedure:                Pre-Anesthesia Assessment:                           - Prior to the procedure, a History and Physical                            was performed, and patient medications and                            allergies were reviewed. The patient's tolerance of                            previous anesthesia was also reviewed. The risks                            and benefits of the procedure and the sedation                            options and risks were discussed with the patient.                            All questions were answered, and informed consent                            was obtained. Prior Anticoagulants: The patient                            last took aspirin 1 day prior to the procedure. ASA                            Grade Assessment: II - A patient with mild systemic                            disease. After reviewing the risks and benefits,                            the patient was deemed in satisfactory condition to  undergo the procedure. After obtaining informed                            consent, the colonoscope was passed under direct                            vision. Throughout the procedure, the patient's                            blood pressure, pulse, and oxygen saturations were   monitored continuously. The CF-HQ190L ([id number])                            scope was introduced through the anus and advanced                            to the 10 cm into the ileum. The colonoscopy was                            somewhat difficult due to a tortuous colon.                            Successful completion of the procedure was aided by                            straightening and shortening the scope to obtain                            bowel loop reduction and COLOWRAP. The patient                            tolerated the procedure well. The quality of the                            bowel preparation was good. The terminal ileum,                            ileocecal valve, appendiceal orifice, and rectum                            were photographed. Scope In: 11:27:49 AM Scope Out: 11:46:40 AM Scope Withdrawal Time: 0 hours 16 minutes 0 seconds  Total Procedure Duration: 0 hours 18 minutes 51 seconds  Findings:      The terminal ileum appeared normal.      Three sessile polyps were found in the splenic flexure and ascending       colon. The polyps were 4 to 6 mm in size. These polyps were removed with       a cold snare. Resection and retrieval were complete. Coagulation for       hemostasis of bleeding caused by the procedure using snare was       successful AT [facility].      External and internal hemorrhoids were found.      The recto-sigmoid colon and sigmoid colon were mildly redundant. Impression:               -  The examined portion of the ileum was normal.                           - Three 4 to 6 mm polyps at the splenic flexure and                            in the ascending colon(2), removed with a cold                            snare. Resected and retrieved.                           - External and internal hemorrhoids.                           - Redundant colon. Moderate Sedation:      Per Anesthesia Care Recommendation:           - Patient has a  contact number available for                            emergencies. The signs and symptoms of potential                            delayed complications were discussed with the                            patient. Return to normal activities tomorrow.                            Written discharge instructions were provided to the                            patient.                           - High fiber diet.                           - Continue present medications.                           - Await pathology results.                           - Repeat colonoscopy in 3 years for surveillance.                           - Return to GI office in 4 months. Procedure Code(s):        --- Professional ---                           [phone], Colonoscopy, flexible; with removal of                            tumor(s), polyp(s), or other lesion(s) by snare  technique Diagnosis Code(s):        --- Professional ---                           K64.8, Other hemorrhoids                           D12.3, Benign neoplasm of transverse colon (hepatic                            flexure or splenic flexure)                           D12.2, Benign neoplasm of ascending colon                           R19.5, Other fecal abnormalities                           R63.4, Abnormal weight loss                           Q43.8, Other specified congenital malformations of                            intestine CPT copyright 2018 [employer]. All rights reserved. The codes documented in this report are preliminary and upon coder review may  be revised to meet current compliance requirements. Barney Drain, MD Barney Drain MD, MD 01/05/2019 12:17:09 PM This report has been signed electronically. Number of Addenda: 0

## 2019-01-05 NOTE — Anesthesia Postprocedure Evaluation (Signed)
Anesthesia Post Note  Patient: Dale Terrell  Procedure(s) Performed: COLONOSCOPY WITH PROPOFOL (N/A ) POLYPECTOMY ESOPHAGOGASTRODUODENOSCOPY (EGD) WITH PROPOFOL  Patient location during evaluation: PACU Anesthesia Type: General Level of consciousness: awake and alert and oriented Pain management: pain level controlled Respiratory status: spontaneous breathing Cardiovascular status: stable Postop Assessment: no apparent nausea or vomiting Anesthetic complications: no     Last Vitals:  Vitals:   01/05/19 1008 01/05/19 1212  BP: 117/79 105/79  Pulse: 96 83  Resp: 15 16  Temp: (!) 36.4 C (!) (P) 36.3 C  SpO2: 100% 100%    Last Pain:  Vitals:   01/05/19 1008  TempSrc: Oral  PainSc: 0-No pain                 ADAMS, AMY A

## 2019-01-05 NOTE — Op Note (Signed)
[name] Patient Name: Dale Terrell Procedure Date: 01/05/2019 11:11 AM MRN: [id number] Date of Birth: Sep 12, 1966 Attending MD: Barney Drain MD, MD CSN: [id number] Age: 53 Admit Type: Outpatient Procedure:                Upper GI endoscopy WITH COLD FORCEPS BIOPSY Indications:              Heme positive stool, Weight loss Providers:                Barney Drain MD, MD, Rosina Lowenstein, RN, Aram Candela Referring MD:             Vicenta Aly Medicines:                Propofol per Anesthesia Complications:            No immediate complications. Estimated Blood Loss:     Estimated blood loss was minimal. Procedure:                Pre-Anesthesia Assessment:                           - Prior to the procedure, a History and Physical                            was performed, and patient medications and                            allergies were reviewed. The patient's tolerance of                            previous anesthesia was also reviewed. The risks                            and benefits of the procedure and the sedation                            options and risks were discussed with the patient.                            All questions were answered, and informed consent                            was obtained. Prior Anticoagulants: The patient                            last took aspirin 1 day prior to the procedure. ASA                            Grade Assessment: II - A patient with mild systemic                            disease. After reviewing the risks and benefits,                            the patient was deemed in satisfactory condition to  undergo the procedure. After obtaining informed                            consent, the endoscope was passed under direct                            vision. Throughout the procedure, the patient's                            blood pressure, pulse, and oxygen saturations were   monitored continuously. The [id number]-[id number] ([id number])                            scope was introduced through the mouth, and                            advanced to the second part of duodenum. The upper                            GI endoscopy was accomplished without difficulty.                            The patient tolerated the procedure well. Scope In: 11:51:18 AM Scope Out: 12:01:01 PM Total Procedure Duration: 0 hours 9 minutes 43 seconds  Findings:      One benign-appearing, intrinsic mild (non-circumferential scarring)       stenosis was found. This stenosis measured 1.6 cm (inner diameter). The       stenosis was traversed.      Multiple small sessile polyps with no stigmata of recent bleeding were       found in the gastric fundus and in the gastric body. The polyp was       removed with a cold biopsy forceps. Resection and retrieval were       complete.      Localized moderate inflammation characterized by congestion (edema),       erosions and erythema was found on the greater curvature of the stomach       and in the gastric antrum. Biopsies were taken with a cold forceps for       Helicobacter pylori testing.      The duodenal bulb was normal. Biopsies for histology were taken with a       cold forceps for evaluation of celiac disease.      The second portion of the duodenum was normal. Biopsies for histology       were taken with a cold forceps for evaluation of celiac disease. Impression:               - Benign-appearing esophageal stenosis.                           - Multiple gastric polyps. Resected and retrieved.                           - MODERATE EROSIVE NSAID Gastritis. Biopsied.                           - NO SOURCE FOR WEIGHT  LOSS IDENTIFIED.                           - HEME POSITIVE STOOLS DUE TO GASTRITIS, POLYPS,                            AND HEMORRHOIDS Moderate Sedation:      Per Anesthesia Care Recommendation:           - Patient has a contact number  available for                            emergencies. The signs and symptoms of potential                            delayed complications were discussed with the                            patient. Return to normal activities tomorrow.                            Written discharge instructions were provided to the                            patient.                           - High fiber diet and low fat diet.                           - Continue present medications. ADD OMPERAZOLE QAC                            BREAKFAST                           - Await pathology results. CHECK TSH TODAY.                           - Return to my office in 4 months. Procedure Code(s):        --- Professional ---                           [phone], Esophagogastroduodenoscopy, flexible,                            transoral; with biopsy, single or multiple Diagnosis Code(s):        --- Professional ---                           K22.2, Esophageal obstruction                           K31.7, Polyp of stomach and duodenum                           K29.70, Gastritis, unspecified, without bleeding  R19.5, Other fecal abnormalities                           R63.4, Abnormal weight loss CPT copyright 2018 [employer]. All rights reserved. The codes documented in this report are preliminary and upon coder review may  be revised to meet current compliance requirements. Barney Drain, MD Barney Drain MD, MD 01/05/2019 12:24:31 PM This report has been signed electronically. Number of Addenda: 0

## 2019-01-05 NOTE — Discharge Instructions (Signed)
NO SOURCE FOR YOUR WEIGHT LOSS WAS IDENTIFIED. The blood detected in your stool was due TO GASTRITIS, POLYPS, AND INTERNAL HEMORRHOIDS. THE LAST PART OF YOUR SMALL BOWEL IS NORMAL. YOU HAD 3 polyps removed. You have EROSIVE gastritis DUE TO ASPIRIN. I biopsied your stomach AND SMALL BOWEL   DRINK WATER TO KEEP YOUR URINE LIGHT YELLOW.  FOLLOW A HIGH FIBER/LOW FAT DIET. AVOID ITEMS THAT CAUSE BLOATING. SEE INFO BELOW.  TO PREVENT ULCERS AND GASTRITIS DUE TO DAILY ASPIRIN USE, START OMEPRAZOLE.  TAKE 30 MINUTES PRIOR TO YOUR FIRST MEAL.  YOUR BIOPSY RESULTS WILL BE BACK IN 5 BUSINESS DAYS.  WE WILL CHECK YOU THYROID TODAY.   FOLLOW UP IN 4 MOS.   Next colonoscopy in 3 years.   ENDOSCOPY Care After Read the instructions outlined below and refer to this sheet in the next week. These discharge instructions provide you with general information on caring for yourself after you leave the hospital. While your treatment has been planned according to the most current medical practices available, unavoidable complications occasionally occur. If you have any problems or questions after discharge, call DR. Ashtyn Meland, [phone].  ACTIVITY  You may resume your regular activity, but move at a slower pace for the next 24 hours.   Take frequent rest periods for the next 24 hours.   Walking will help get rid of the air and reduce the bloated feeling in your belly (abdomen).   No driving for 24 hours (because of the medicine (anesthesia) used during the test).   You may shower.   Do not sign any important legal documents or operate any machinery for 24 hours (because of the anesthesia used during the test).    NUTRITION  Drink plenty of fluids.   You may resume your normal diet as instructed by your doctor.   Begin with a light meal and progress to your normal diet. Heavy or fried foods are harder to digest and may make you feel sick to your stomach (nauseated).   Avoid alcoholic beverages  for 24 hours or as instructed.    MEDICATIONS  You may resume your normal medications.   WHAT YOU CAN EXPECT TODAY  Some feelings of bloating in the abdomen.   Passage of more gas than usual.   Spotting of blood in your stool or on the toilet paper  .  IF YOU HAD POLYPS REMOVED DURING THE ENDOSCOPY:  Eat a soft diet IF YOU HAVE NAUSEA, BLOATING, ABDOMINAL PAIN, OR VOMITING.    FINDING OUT THE RESULTS OF YOUR TEST Not all test results are available during your visit. DR. Oneida Alar WILL CALL YOU WITHIN 7 DAYS OF YOUR PROCEDUE WITH YOUR RESULTS. Do not assume everything is normal if you have not heard from DR. Hussain Maimone IN ONE WEEK, CALL HER OFFICE AT [phone].  SEEK IMMEDIATE MEDICAL ATTENTION AND CALL THE OFFICE: [phone] IF:  You have more than a spotting of blood in your stool.   Your belly is swollen (abdominal distention).   You are nauseated or vomiting.   You have a temperature over 101F.   You have abdominal pain or discomfort that is severe or gets worse throughout the day.  Polyps, Colon  A polyp is extra tissue that grows inside your body. Colon polyps grow in the large intestine. The large intestine, also called the colon, is part of your digestive system. It is a long, hollow tube at the end of your digestive tract where your body makes and stores stool.  Most polyps are not dangerous. They are benign. This means they are not cancerous. But over time, some types of polyps can turn into cancer. Polyps that are smaller than a pea are usually not harmful. But larger polyps could someday become or may already be cancerous. To be safe, doctors remove all polyps and test them.   WHO GETS POLYPS? Anyone can get polyps, but certain people are more likely than others. You may have a greater chance of getting polyps if:  You are over 50.   You have had polyps before.   Someone in your family has had polyps.   Someone in your family has had cancer of the large  intestine.   Find out if someone in your family has had polyps. You may also be more likely to get polyps if you:   Eat a lot of fatty foods   Smoke   Drink alcohol   Do not exercise  Eat too much   PREVENTION There is not one sure way to prevent polyps. You might be able to lower your risk of getting them if you:  Eat more fruits and vegetables and less fatty food.   Do not smoke.   Avoid alcohol.   Exercise every day.   Lose weight if you are overweight.   Eating more calcium and folate can also lower your risk of getting polyps. Some foods that are rich in calcium are milk, cheese, and broccoli. Some foods that are rich in folate are chickpeas, kidney beans, and spinach.   Gastritis  Gastritis is an inflammation (the body's way of reacting to injury and/or infection) of the stomach. It is often caused by viral or bacterial (germ) infections. It can also be caused BY ASPIRIN, BC/GOODY POWDER'S, (IBUPROFEN) MOTRIN, OR ALEVE (NAPROXEN), chemicals (including alcohol), SPICY FOODS, and medications. This illness may be associated with generalized malaise (feeling tired, not well), UPPER ABDOMINAL STOMACH cramps, and fever. One common bacterial cause of gastritis is an organism known as H. Pylori. This can be treated with antibiotics.    High-Fiber Diet A high-fiber diet changes your normal diet to include more whole grains, legumes, fruits, and vegetables. Changes in the diet involve replacing refined carbohydrates with unrefined foods. The calorie level of the diet is essentially unchanged. The Dietary Reference Intake (recommended amount) for adult males is 38 grams per day. For adult females, it is 25 grams per day. Pregnant and lactating women should consume 28 grams of fiber per day. Fiber is the intact part of a plant that is not broken down during digestion. Functional fiber is fiber that has been isolated from the plant to provide a beneficial effect in the  body.  PURPOSE  Increase stool bulk.   Ease and regulate bowel movements.   Lower cholesterol.   REDUCE RISK OF COLON CANCER  INDICATIONS THAT YOU NEED MORE FIBER  Constipation and hemorrhoids.   Uncomplicated diverticulosis (intestine condition) and irritable bowel syndrome.   Weight management.   As a protective measure against hardening of the arteries (atherosclerosis), diabetes, and cancer.   GUIDELINES FOR INCREASING FIBER IN THE DIET  Start adding fiber to the diet slowly. A gradual increase of about 5 more grams (2 slices of whole-wheat bread, 2 servings of most fruits or vegetables, or 1 bowl of high-fiber cereal) per day is best. Too rapid an increase in fiber may result in constipation, flatulence, and bloating.   Drink enough water and fluids to keep your urine clear or pale  yellow. Water, juice, or caffeine-free drinks are recommended. Not drinking enough fluid may cause constipation.   Eat a variety of high-fiber foods rather than one type of fiber.   Try to increase your intake of fiber through using high-fiber foods rather than fiber pills or supplements that contain small amounts of fiber.   The goal is to change the types of food eaten. Do not supplement your present diet with high-fiber foods, but replace foods in your present diet.    INCLUDE A VARIETY OF FIBER SOURCES  Replace refined and processed grains with whole grains, canned fruits with fresh fruits, and incorporate other fiber sources. White rice, white breads, and most bakery goods contain little or no fiber.   Brown whole-grain rice, buckwheat oats, and many fruits and vegetables are all good sources of fiber. These include: broccoli, Brussels sprouts, cabbage, cauliflower, beets, sweet potatoes, white potatoes (skin on), carrots, tomatoes, eggplant, squash, berries, fresh fruits, and dried fruits.   Cereals appear to be the richest source of fiber. Cereal fiber is found in whole grains and bran.  Bran is the fiber-rich outer coat of cereal grain, which is largely removed in refining. In whole-grain cereals, the bran remains. In breakfast cereals, the largest amount of fiber is found in those with "bran" in their names. The fiber content is sometimes indicated on the label.   You may need to include additional fruits and vegetables each day.   In baking, for 1 cup white flour, you may use the following substitutions:   1 cup whole-wheat flour minus 2 tablespoons.   1/2 cup white flour plus 1/2 cup whole-wheat flour.   Low-Fat Diet BREADS, CEREALS, PASTA, RICE, DRIED PEAS, AND BEANS These products are high in carbohydrates and most are low in fat. Therefore, they can be increased in the diet as substitutes for fatty foods. They too, however, contain calories and should not be eaten in excess. Cereals can be eaten for snacks as well as for breakfast.  Include foods that contain fiber (fruits, vegetables, whole grains, and legumes). Research shows that fiber may lower blood cholesterol levels, especially the water-soluble fiber found in fruits, vegetables, oat products, and legumes. FRUITS AND VEGETABLES It is good to eat fruits and vegetables. Besides being sources of fiber, both are rich in vitamins and some minerals. They help you get the daily allowances of these nutrients. Fruits and vegetables can be used for snacks and desserts. MEATS Limit lean meat, chicken, [place], and fish to no more than 6 ounces per day. Beef, Pork, and Lamb Use lean cuts of beef, pork, and lamb. Lean cuts include:  Extra-lean ground beef.  Arm roast.  Sirloin tip.  Center-cut ham.  Round steak.  Loin chops.  Rump roast.  Tenderloin.  Trim all fat off the outside of meats before cooking. It is not necessary to severely decrease the intake of red meat, but lean choices should be made. Lean meat is rich in protein and contains a highly absorbable form of iron. Premenopausal women, in particular, should  avoid reducing lean red meat because this could increase the risk for low red blood cells (iron-deficiency anemia). The organ meats, such as liver, sweetbreads, kidneys, and brain are very rich in cholesterol. They should be limited. Chicken and [place] These are good sources of protein. The fat of poultry can be reduced by removing the skin and underlying fat layers before cooking. Chicken and [place] can be substituted for lean red meat in the diet. Poultry should  not be fried or covered with high-fat sauces. Fish and Shellfish Fish is a good source of protein. Shellfish contain cholesterol, but they usually are low in saturated fatty acids. The preparation of fish is important. Like chicken and [place], they should not be fried or covered with high-fat sauces. EGGS Egg whites contain no fat or cholesterol. They can be eaten often. Try 1 to 2 egg whites instead of whole eggs in recipes or use egg substitutes that do not contain yolk. MILK AND DAIRY PRODUCTS Use skim or 1% milk instead of 2% or whole milk. Decrease whole milk, natural, and processed cheeses. Use nonfat or low-fat (2%) cottage cheese or low-fat cheeses made from vegetable oils. Choose nonfat or low-fat (1 to 2%) yogurt. Experiment with evaporated skim milk in recipes that call for heavy cream. Substitute low-fat yogurt or low-fat cottage cheese for sour cream in dips and salad dressings. Have at least 2 servings of low-fat dairy products, such as 2 glasses of skim (or 1%) milk each day to help get your daily calcium intake.  FATS AND OILS Reduce the total intake of fats, especially saturated fat. Butterfat, lard, and beef fats are high in saturated fat and cholesterol. These should be avoided as much as possible. Vegetable fats do not contain cholesterol, but certain vegetable fats, such as coconut oil, palm oil, and palm kernel oil are very high in saturated fats. These should be limited. These fats are often used in bakery goods,  processed foods, popcorn, oils, and nondairy creamers. Vegetable shortenings and some peanut butters contain hydrogenated oils, which are also saturated fats. Read the labels on these foods and check for saturated vegetable oils. Unsaturated vegetable oils and fats do not raise blood cholesterol. However, they should be limited because they are fats and are high in calories. Total fat should still be limited to 30% of your daily caloric intake. Desirable liquid vegetable oils are corn oil, cottonseed oil, olive oil, canola oil, safflower oil, soybean oil, and sunflower oil. Peanut oil is not as good, but small amounts are acceptable. Buy a heart-healthy tub margarine that has no partially hydrogenated oils in the ingredients. Mayonnaise and salad dressings often are made from unsaturated fats, but they should also be limited because of their high calorie and fat content. Seeds, nuts, peanut butter, olives, and avocados are high in fat, but the fat is mainly the unsaturated type. These foods should be limited mainly to avoid excess calories and fat. OTHER EATING TIPS Snacks  Most sweets should be limited as snacks. They tend to be rich in calories and fats, and their caloric content outweighs their nutritional value. Some good choices in snacks are graham crackers, melba toast, soda crackers, bagels (no egg), English muffins, fruits, and vegetables. These snacks are preferable to snack crackers, [place] fries, and chips. Popcorn should be air-popped or cooked in small amounts of liquid vegetable oil. Desserts Eat fruit, low-fat yogurt, and fruit ices. AVOID pastries, cake, and cookies. Sherbet, angel food cake, gelatin dessert, frozen low-fat yogurt, or other frozen products that do not contain saturated fat (pure fruit juice bars, frozen ice pops) are also acceptable.  COOKING METHODS Choose those methods that use little or no fat. They include: Poaching.  Braising.  Steaming.  Grilling.  Baking.   Stir-frying.  Broiling.  Microwaving.  Foods can be cooked in a nonstick pan without added fat, or use a nonfat cooking spray in regular cookware. Limit fried foods and avoid frying in saturated fat. Add  moisture to lean meats by using water, broth, cooking wines, and other nonfat or low-fat sauces along with the cooking methods mentioned above. Soups and stews should be chilled after cooking. The fat that forms on top after a few hours in the refrigerator should be skimmed off. When preparing meals, avoid using excess salt. Salt can contribute to raising blood pressure in some people. EATING AWAY FROM HOME Order entres, potatoes, and vegetables without sauces or butter. When meat exceeds the size of a deck of cards (3 to 4 ounces), the rest can be taken home for another meal. Choose vegetable or fruit salads and ask for low-calorie salad dressings to be served on the side. Use dressings sparingly. Limit high-fat toppings, such as bacon, crumbled eggs, cheese, sunflower seeds, and olives. Ask for heart-healthy tub margarine instead of butter.

## 2019-01-05 NOTE — Transfer of Care (Signed)
Immediate Anesthesia Transfer of Care Note  Patient: Dale Terrell  Procedure(s) Performed: COLONOSCOPY WITH PROPOFOL (N/A ) POLYPECTOMY ESOPHAGOGASTRODUODENOSCOPY (EGD) WITH PROPOFOL  Patient Location: PACU  Anesthesia Type:General  Level of Consciousness: awake, oriented and patient cooperative  Airway & Oxygen Therapy: Patient Spontanous Breathing  Post-op Assessment: Report given to RN and Post -op Vital signs reviewed and stable  Post vital signs: Reviewed and stable  Last Vitals:  Vitals Value Taken Time  BP 105/79 01/05/2019 12:12 PM  Temp    Pulse 82 01/05/2019 12:14 PM  Resp 16 01/05/2019 12:14 PM  SpO2 100 % 01/05/2019 12:14 PM  Vitals shown include unvalidated device data.  Last Pain:  Vitals:   01/05/19 1008  TempSrc: Oral  PainSc: 0-No pain         Complications: No apparent anesthesia complications

## 2019-01-05 NOTE — Anesthesia Preprocedure Evaluation (Signed)
Anesthesia Evaluation  Patient identified by MRN, date of birth, ID band Patient awake    Reviewed: Allergy & Precautions, NPO status , Patient's Chart, lab work & pertinent test results  Airway Mallampati: II  TM Distance: >3 FB Neck ROM: Full    Dental no notable dental hx. (+) Teeth Intact   Pulmonary neg pulmonary ROS, Current Smoker,    Pulmonary exam normal breath sounds clear to auscultation       Cardiovascular Exercise Tolerance: Good hypertension, Pt. on medications negative cardio ROS Normal cardiovascular examI Rhythm:Regular Rate:Normal     Neuro/Psych Seizures -, Well Controlled,  Depression negative psych ROS   GI/Hepatic negative GI ROS, Neg liver ROS,   Endo/Other  negative endocrine ROSdiabetes, Well Controlled, Type 2  Renal/GU negative Renal ROS  negative genitourinary   Musculoskeletal negative musculoskeletal ROS (+)   Abdominal   Peds negative pediatric ROS (+)  Hematology negative hematology ROS (+)   Anesthesia Other Findings   Reproductive/Obstetrics negative OB ROS                             Anesthesia Physical Anesthesia Plan  ASA: II  Anesthesia Plan: General   Post-op Pain Management:    Induction: Intravenous  PONV Risk Score and Plan:   Airway Management Planned: Nasal Cannula and Simple Face Mask  Additional Equipment:   Intra-op Plan:   Post-operative Plan:   Informed Consent: I have reviewed the patients History and Physical, chart, labs and discussed the procedure including the risks, benefits and alternatives for the proposed anesthesia with the patient or authorized representative who has indicated his/her understanding and acceptance.     Dental advisory given  Plan Discussed with: CRNA  Anesthesia Plan Comments:         Anesthesia Quick Evaluation

## 2019-01-05 NOTE — H&P (Signed)
Primary Care Physician:  Vicenta Aly, [place] Primary Gastroenterologist:  Dr. Oneida Alar  Pre-Procedure History & Physical: HPI:  Dale Terrell is a 53 y.o. male here for HEME POS STOOLS/weight loss/Screen for BARRETT'S.  Past Medical History:  Diagnosis Date  . Chronic back pain   . Depression    on zoloft  . Diabetes mellitus without complication ([place])   . Hyperlipidemia   . Hypertension   . Seizure ([place])    as a child, umknown etiology and on no meds ever, only had 1.    Past Surgical History:  Procedure Laterality Date  . cyst on arm Right     Prior to Admission medications   Medication Sig Start Date End Date Taking? Authorizing Provider  acetaminophen (TYLENOL) 500 MG tablet Take 500 mg by mouth every 6 (six) hours as needed for moderate pain.   Yes [provider]  aspirin EC 81 MG tablet Take 81 mg by mouth daily.   Yes [provider]  glimepiride (AMARYL) 2 MG tablet Take 2 mg by mouth daily.  08/05/18  Yes [provider]  lisinopril (PRINIVIL,ZESTRIL) 5 MG tablet Take 5 mg by mouth daily. 08/30/13  Yes [provider]  metFORMIN (GLUCOPHAGE) 1000 MG tablet Take 1,000 mg by mouth 2 (two) times daily with a meal.   Yes [provider]  Na Sulfate-K Sulfate-Mg Sulf (SUPREP BOWEL PREP KIT) 17.5-3.13-1.6 GM/177ML SOLN Take 1 kit by mouth as directed. 10/06/18  Yes Tris Howell L, MD  sertraline (ZOLOFT) 100 MG tablet Take 100 mg by mouth daily.  04/20/15  Yes [provider]  simvastatin (ZOCOR) 40 MG tablet Take 40 mg by mouth at bedtime. 08/30/13  Yes [provider]  tiZANidine (ZANAFLEX) 4 MG tablet One by mouth every 8 hours as needed for spasm Patient taking differently: Take 4 mg by mouth every 8 (eight) hours as needed for muscle spasms.  04/18/16  Yes Sanjuana Kava, MD    Allergies as of 10/06/2018 - Review Complete 09/30/2018  Allergen Reaction Noted  . Bee venom  09/30/2018    Family History   Problem Relation Age of Onset  . Colon cancer Neg Hx   . Colon polyps Neg Hx     Social History   Socioeconomic History  . Marital status: Single    Spouse name: Not on file  . Number of children: Not on file  . Years of education: Not on file  . Highest education level: Not on file  Occupational History  . Not on file  Social Needs  . Financial resource strain: Not on file  . Food insecurity:    Worry: Not on file    Inability: Not on file  . Transportation needs:    Medical: Not on file    Non-medical: Not on file  Tobacco Use  . Smoking status: Current Some Day Smoker    Packs/day: 0.25    Years: 2.00    Pack years: 0.50    Types: Cigarettes  . Smokeless tobacco: Never Used  . Tobacco comment: smokes "every so often".   Substance and Sexual Activity  . Alcohol use: Not Currently    Comment: in the past  . Drug use: No  . Sexual activity: Not Currently    Birth control/protection: None  Lifestyle  . Physical activity:    Days per week: Not on file    Minutes per session: Not on file  . Stress: Not on file  Relationships  .  Social connections:    Talks on phone: Not on file    Gets together: Not on file    Attends religious service: Not on file    Active member of club or organization: Not on file    Attends meetings of clubs or organizations: Not on file    Relationship status: Not on file  . Intimate partner violence:    Fear of current or ex partner: Not on file    Emotionally abused: Not on file    Physically abused: Not on file    Forced sexual activity: Not on file  Other Topics Concern  . Not on file  Social History Narrative  . Not on file    Review of Systems: See HPI, otherwise negative ROS   Physical Exam: BP 117/79   Pulse 96   Temp (!) 97.5 F (36.4 C) (Oral)   Resp 15   SpO2 100%  General:   Alert,  pleasant and cooperative in NAD Head:  Normocephalic and atraumatic. Neck:  Supple; Lungs:  Clear throughout to auscultation.     Heart:  Regular rate and rhythm. Abdomen:  Soft, nontender and nondistended. Normal bowel sounds, without guarding, and without rebound.   Neurologic:  Alert and  oriented x4;  grossly normal neurologically.  Impression/Plan:     HEME POS STOOLS/weight loss  PLAN:  1.TCS/POSSIBLE EGD TODAY. DISCUSSED PROCEDURE, BENEFITS, & RISKS: < 1% chance of medication reaction, bleeding, perforation, or rupture of spleen/liver.

## 2019-01-06 NOTE — Progress Notes (Signed)
CC'D TO PCP °

## 2019-01-06 NOTE — Addendum Note (Signed)
Addendum  created 01/06/19 0730 by Charmaine Downs, CRNA   Charge Capture section accepted

## 2019-01-07 ENCOUNTER — Telehealth: Payer: Self-pay | Admitting: Gastroenterology

## 2019-01-07 DIAGNOSIS — R634 Abnormal weight loss: Secondary | ICD-10-CM

## 2019-01-07 NOTE — Telephone Encounter (Signed)
PT is aware and said that it will probably be next month before he can get a ride to the hospital to do the x-ray. He also would like his July appt changed to a Monday or a Wednesday, that is when he can get transportation.  Sending to [place] to reschedule.

## 2019-01-07 NOTE — Telephone Encounter (Signed)
Please call pt. HE had THREE simple adenomas removed.His stomach Bx shows mild gastritis DUE TO ASA USE AND SMALL BOWEL BIOPSIES SHOW DUODENITIS DUE TO ASA. NO SOURCE FOR HIS WEIGHT LOSS WAS IDENTIFIED. HIS THYROID TEST IS NORMAL.  HE SHOULD COMPLETE A CHEST XRAY: PA/LATERAL.Marland Kitchen   DRINK WATER TO KEEP YOUR URINE LIGHT YELLOW. FOLLOW A HIGH FIBER/LOW FAT DIET. AVOID ITEMS THAT CAUSE BLOATING.  TO PREVENT ULCERS AND GASTRITIS/DUODENITIS DUE TO DAILY ASPIRIN USE, START OMEPRAZOLE.  TAKE 30 MINUTES PRIOR TO YOUR FIRST MEAL.  FOLLOW UP IN 4 MOS.  Next colonoscopy in 3 years.

## 2019-01-07 NOTE — Telephone Encounter (Signed)
LMOM to call.

## 2019-01-07 NOTE — Telephone Encounter (Signed)
REVIEWED-NO ADDITIONAL RECOMMENDATIONS. 

## 2019-01-07 NOTE — Telephone Encounter (Signed)
Reminder in epic and OV made °

## 2019-01-08 ENCOUNTER — Encounter (HOSPITAL_COMMUNITY): Payer: Self-pay | Admitting: Gastroenterology

## 2019-01-11 ENCOUNTER — Encounter: Payer: Self-pay | Admitting: Gastroenterology

## 2019-01-11 NOTE — Telephone Encounter (Signed)
PATIENT RESCHEDULED AND LETTER SENT

## 2019-05-07 ENCOUNTER — Ambulatory Visit: Payer: Medicare Other | Admitting: Gastroenterology

## 2019-05-10 ENCOUNTER — Ambulatory Visit: Payer: Medicare Other | Admitting: Gastroenterology

## 2019-05-18 DIAGNOSIS — E119 Type 2 diabetes mellitus without complications: Secondary | ICD-10-CM | POA: Diagnosis not present

## 2019-05-18 DIAGNOSIS — I10 Essential (primary) hypertension: Secondary | ICD-10-CM | POA: Diagnosis not present

## 2019-05-18 DIAGNOSIS — Z79899 Other long term (current) drug therapy: Secondary | ICD-10-CM | POA: Diagnosis not present

## 2019-05-18 DIAGNOSIS — E785 Hyperlipidemia, unspecified: Secondary | ICD-10-CM | POA: Diagnosis not present

## 2019-05-18 DIAGNOSIS — Z0000 Encounter for general adult medical examination without abnormal findings: Secondary | ICD-10-CM | POA: Diagnosis not present

## 2019-05-31 ENCOUNTER — Telehealth: Payer: Self-pay | Admitting: Gastroenterology

## 2019-05-31 NOTE — Telephone Encounter (Signed)
Pt had to reschedule his appt and is aware of new date and time. He wanted to know if he could still have his xray done that was ordered back in March 2020 or does he need to get a new order? Please advise. [phone]

## 2019-05-31 NOTE — Telephone Encounter (Signed)
Called patient and made aware no appt required for CXR. Order in epic

## 2019-06-08 ENCOUNTER — Ambulatory Visit: Payer: Medicare Other | Admitting: Nurse Practitioner

## 2019-07-07 ENCOUNTER — Ambulatory Visit: Payer: Medicare Other | Admitting: Nurse Practitioner

## 2019-08-16 ENCOUNTER — Ambulatory Visit: Payer: Medicare Other | Admitting: Nurse Practitioner

## 2019-11-03 DIAGNOSIS — R1084 Generalized abdominal pain: Secondary | ICD-10-CM | POA: Diagnosis not present

## 2019-11-03 DIAGNOSIS — R1033 Periumbilical pain: Secondary | ICD-10-CM | POA: Diagnosis not present

## 2019-11-03 DIAGNOSIS — R52 Pain, unspecified: Secondary | ICD-10-CM | POA: Diagnosis not present

## 2019-11-05 ENCOUNTER — Encounter (HOSPITAL_COMMUNITY): Payer: Self-pay | Admitting: *Deleted

## 2019-11-05 ENCOUNTER — Observation Stay (HOSPITAL_BASED_OUTPATIENT_CLINIC_OR_DEPARTMENT_OTHER)
Admission: EM | Admit: 2019-11-05 | Discharge: 2019-11-06 | Disposition: A | Discharge status: Home / Self Care | Payer: Medicare Other | Source: Home / Self Care | Attending: Emergency Medicine | Admitting: Emergency Medicine

## 2019-11-05 ENCOUNTER — Emergency Department (HOSPITAL_COMMUNITY): Payer: Medicare Other

## 2019-11-05 ENCOUNTER — Other Ambulatory Visit: Payer: Self-pay

## 2019-11-05 DIAGNOSIS — I10 Essential (primary) hypertension: Secondary | ICD-10-CM | POA: Diagnosis present

## 2019-11-05 DIAGNOSIS — Z7982 Long term (current) use of aspirin: Secondary | ICD-10-CM | POA: Insufficient documentation

## 2019-11-05 DIAGNOSIS — E785 Hyperlipidemia, unspecified: Secondary | ICD-10-CM

## 2019-11-05 DIAGNOSIS — E1169 Type 2 diabetes mellitus with other specified complication: Secondary | ICD-10-CM | POA: Diagnosis not present

## 2019-11-05 DIAGNOSIS — E11649 Type 2 diabetes mellitus with hypoglycemia without coma: Secondary | ICD-10-CM | POA: Insufficient documentation

## 2019-11-05 DIAGNOSIS — K8000 Calculus of gallbladder with acute cholecystitis without obstruction: Secondary | ICD-10-CM | POA: Insufficient documentation

## 2019-11-05 DIAGNOSIS — Z20822 Contact with and (suspected) exposure to covid-19: Secondary | ICD-10-CM | POA: Diagnosis not present

## 2019-11-05 DIAGNOSIS — Z87891 Personal history of nicotine dependence: Secondary | ICD-10-CM | POA: Insufficient documentation

## 2019-11-05 DIAGNOSIS — Z23 Encounter for immunization: Secondary | ICD-10-CM | POA: Diagnosis not present

## 2019-11-05 DIAGNOSIS — I152 Hypertension secondary to endocrine disorders: Secondary | ICD-10-CM | POA: Diagnosis present

## 2019-11-05 DIAGNOSIS — R1110 Vomiting, unspecified: Secondary | ICD-10-CM | POA: Diagnosis not present

## 2019-11-05 DIAGNOSIS — K810 Acute cholecystitis: Secondary | ICD-10-CM | POA: Diagnosis not present

## 2019-11-05 DIAGNOSIS — Z7984 Long term (current) use of oral hypoglycemic drugs: Secondary | ICD-10-CM | POA: Insufficient documentation

## 2019-11-05 DIAGNOSIS — Z79899 Other long term (current) drug therapy: Secondary | ICD-10-CM | POA: Insufficient documentation

## 2019-11-05 DIAGNOSIS — Z881 Allergy status to other antibiotic agents status: Secondary | ICD-10-CM | POA: Insufficient documentation

## 2019-11-05 DIAGNOSIS — K219 Gastro-esophageal reflux disease without esophagitis: Secondary | ICD-10-CM | POA: Insufficient documentation

## 2019-11-05 DIAGNOSIS — K82A1 Gangrene of gallbladder in cholecystitis: Secondary | ICD-10-CM | POA: Diagnosis not present

## 2019-11-05 DIAGNOSIS — N3090 Cystitis, unspecified without hematuria: Secondary | ICD-10-CM | POA: Diagnosis not present

## 2019-11-05 DIAGNOSIS — F329 Major depressive disorder, single episode, unspecified: Secondary | ICD-10-CM | POA: Insufficient documentation

## 2019-11-05 DIAGNOSIS — Z5331 Laparoscopic surgical procedure converted to open procedure: Secondary | ICD-10-CM | POA: Diagnosis not present

## 2019-11-05 DIAGNOSIS — E1159 Type 2 diabetes mellitus with other circulatory complications: Secondary | ICD-10-CM | POA: Diagnosis present

## 2019-11-05 LAB — CBC WITH DIFFERENTIAL/PLATELET
Abs Immature Granulocytes: 0.05 10*3/uL (ref 0.00–0.07)
Basophils Absolute: 0 10*3/uL (ref 0.0–0.1)
Basophils Relative: 0 %
Eosinophils Absolute: 0.1 10*3/uL (ref 0.0–0.5)
Eosinophils Relative: 1 %
HCT: 42.3 % (ref 39.0–52.0)
Hemoglobin: 14.1 g/dL (ref 13.0–17.0)
Immature Granulocytes: 0 %
Lymphocytes Relative: 10 %
Lymphs Abs: 1.4 10*3/uL (ref 0.7–4.0)
MCH: 32.1 pg (ref 26.0–34.0)
MCHC: 33.3 g/dL (ref 30.0–36.0)
MCV: 96.4 fL (ref 80.0–100.0)
Monocytes Absolute: 0.4 10*3/uL (ref 0.1–1.0)
Monocytes Relative: 3 %
Neutro Abs: 12.3 10*3/uL — ABNORMAL HIGH (ref 1.7–7.7)
Neutrophils Relative %: 86 %
Platelets: 249 10*3/uL (ref 150–400)
RBC: 4.39 MIL/uL (ref 4.22–5.81)
RDW: 12.7 % (ref 11.5–15.5)
WBC: 14.3 10*3/uL — ABNORMAL HIGH (ref 4.0–10.5)
nRBC: 0 % (ref 0.0–0.2)

## 2019-11-05 LAB — COMPREHENSIVE METABOLIC PANEL
ALT: 19 U/L (ref 0–44)
AST: 19 U/L (ref 15–41)
Albumin: 3.5 g/dL (ref 3.5–5.0)
Alkaline Phosphatase: 43 U/L (ref 38–126)
Anion gap: 9 (ref 5–15)
BUN: 27 mg/dL — ABNORMAL HIGH (ref 6–20)
CO2: 25 mmol/L (ref 22–32)
Calcium: 9.1 mg/dL (ref 8.9–10.3)
Chloride: 98 mmol/L (ref 98–111)
Creatinine, Ser: 0.98 mg/dL (ref 0.61–1.24)
GFR calc Af Amer: >60 mL/min (ref >60)
GFR calc non Af Amer: >60 mL/min (ref >60)
Glucose, Bld: 131 mg/dL — ABNORMAL HIGH (ref 70–99)
Potassium: 3.9 mmol/L (ref 3.5–5.1)
Sodium: 132 mmol/L — ABNORMAL LOW (ref 135–145)
Total Bilirubin: 1.1 mg/dL (ref 0.3–1.2)
Total Protein: 8 g/dL (ref 6.5–8.1)

## 2019-11-05 LAB — URINALYSIS, ROUTINE W REFLEX MICROSCOPIC
Bacteria, UA: NONE SEEN
Bilirubin Urine: NEGATIVE
Glucose, UA: NEGATIVE mg/dL
Ketones, ur: NEGATIVE mg/dL
Leukocytes,Ua: NEGATIVE
Nitrite: NEGATIVE
Protein, ur: 30 mg/dL — AB
Specific Gravity, Urine: 1.021 (ref 1.005–1.030)
pH: 5 (ref 5.0–8.0)

## 2019-11-05 LAB — RESPIRATORY PANEL BY RT PCR (FLU A&B, COVID)
Influenza A by PCR: NEGATIVE
Influenza B by PCR: NEGATIVE
SARS Coronavirus 2 by RT PCR: NEGATIVE

## 2019-11-05 LAB — CBG MONITORING, ED
Glucose-Capillary: 81 mg/dL (ref 70–99)
Glucose-Capillary: 42 mg/dL — CL (ref 70–99)
Glucose-Capillary: 158 mg/dL — ABNORMAL HIGH (ref 70–99)

## 2019-11-05 LAB — LACTIC ACID, PLASMA: Lactic Acid, Venous: 1 mmol/L (ref 0.5–1.9)

## 2019-11-05 LAB — LIPASE, BLOOD: Lipase: 25 U/L (ref 11–51)

## 2019-11-05 MED ORDER — ONDANSETRON HCL 4 MG/2ML IJ SOLN: 4.0000 mg | Freq: Four times a day (QID) | INTRAMUSCULAR | Status: DC | PRN

## 2019-11-05 MED ORDER — ENOXAPARIN SODIUM 40 MG/0.4ML ~~LOC~~ SOLN
40.0000 mg | SUBCUTANEOUS | Status: DC
  Administered 2019-11-05: 20:00:00 40 mg via SUBCUTANEOUS
  Filled 2019-11-05: qty 0.4

## 2019-11-05 MED ORDER — ONDANSETRON HCL 4 MG PO TABS: 4.0000 mg | ORAL_TABLET | Freq: Four times a day (QID) | ORAL | Status: DC | PRN

## 2019-11-05 MED ORDER — PIPERACILLIN-TAZOBACTAM 3.375 G IVPB
3.3750 g | Freq: Three times a day (TID) | INTRAVENOUS | Status: DC
  Administered 2019-11-05 – 2019-11-06 (×2): 3.375 g via INTRAVENOUS
  Filled 2019-11-05 (×2): qty 50

## 2019-11-05 MED ORDER — PANTOPRAZOLE SODIUM 40 MG PO TBEC: 40.0000 mg | DELAYED_RELEASE_TABLET | Freq: Every day | ORAL | Status: DC

## 2019-11-05 MED ORDER — SODIUM CHLORIDE 0.9% FLUSH
3.0000 mL | Freq: Once | INTRAVENOUS | Status: AC
  Administered 2019-11-05: 10:00:00 3 mL via INTRAVENOUS

## 2019-11-05 MED ORDER — DEXTROSE 50 % IV SOLN
50.0000 mL | Freq: Once | INTRAVENOUS | Status: AC
  Administered 2019-11-05: 22:00:00 50 mL via INTRAVENOUS
  Filled 2019-11-05: qty 50

## 2019-11-05 MED ORDER — ACETAMINOPHEN 650 MG RE SUPP: 650.0000 mg | Freq: Four times a day (QID) | RECTAL | Status: DC | PRN

## 2019-11-05 MED ORDER — SODIUM CHLORIDE 0.9 % IV BOLUS
1000.0000 mL | Freq: Once | INTRAVENOUS | Status: AC
  Administered 2019-11-05: 14:00:00 1000 mL via INTRAVENOUS

## 2019-11-05 MED ORDER — DIPHENHYDRAMINE HCL 50 MG/ML IJ SOLN
25.0000 mg | Freq: Once | INTRAMUSCULAR | Status: AC
  Administered 2019-11-05: 14:00:00 25 mg via INTRAVENOUS
  Filled 2019-11-05: qty 1

## 2019-11-05 MED ORDER — INSULIN ASPART 100 UNIT/ML ~~LOC~~ SOLN: 0.0000 [IU] | Freq: Every day | SUBCUTANEOUS | Status: DC

## 2019-11-05 MED ORDER — ACETAMINOPHEN 325 MG PO TABS: 650.0000 mg | ORAL_TABLET | Freq: Four times a day (QID) | ORAL | Status: DC | PRN

## 2019-11-05 MED ORDER — CIPROFLOXACIN HCL 500 MG PO TABS: 500.0000 mg | ORAL_TABLET | Freq: Two times a day (BID) | ORAL | 0 refills | Status: DC

## 2019-11-05 MED ORDER — SODIUM CHLORIDE 0.9 % IV BOLUS
1000.0000 mL | Freq: Once | INTRAVENOUS | Status: AC
  Administered 2019-11-05: 1000 mL via INTRAVENOUS

## 2019-11-05 MED ORDER — HYDROCODONE-ACETAMINOPHEN 5-325 MG PO TABS: 1.0000 | ORAL_TABLET | ORAL | 0 refills | Status: DC | PRN

## 2019-11-05 MED ORDER — ASPIRIN EC 81 MG PO TBEC
81.0000 mg | DELAYED_RELEASE_TABLET | Freq: Every day | ORAL | Status: DC
  Filled 2019-11-05: qty 1

## 2019-11-05 MED ORDER — HYDROCODONE-ACETAMINOPHEN 5-325 MG PO TABS
1.0000 | ORAL_TABLET | ORAL | Status: DC | PRN
  Administered 2019-11-05: 1 via ORAL
  Filled 2019-11-05: qty 1

## 2019-11-05 MED ORDER — IOHEXOL 300 MG/ML  SOLN
100.0000 mL | Freq: Once | INTRAMUSCULAR | Status: AC | PRN
  Administered 2019-11-05: 11:00:00 100 mL via INTRAVENOUS

## 2019-11-05 MED ORDER — PIPERACILLIN-TAZOBACTAM 3.375 G IVPB 30 MIN
3.3750 g | Freq: Once | INTRAVENOUS | Status: AC
  Administered 2019-11-05: 3.375 g via INTRAVENOUS
  Filled 2019-11-05: qty 50

## 2019-11-05 MED ORDER — LACTATED RINGERS IV SOLN: INTRAVENOUS | Status: DC

## 2019-11-05 MED ORDER — METRONIDAZOLE 500 MG PO TABS: 500.0000 mg | ORAL_TABLET | Freq: Two times a day (BID) | ORAL | 0 refills | Status: DC

## 2019-11-05 MED ORDER — CIPROFLOXACIN IN D5W 400 MG/200ML IV SOLN
400.0000 mg | Freq: Once | INTRAVENOUS | Status: AC
  Administered 2019-11-05: 13:00:00 400 mg via INTRAVENOUS
  Filled 2019-11-05: qty 200

## 2019-11-05 MED ORDER — INSULIN ASPART 100 UNIT/ML ~~LOC~~ SOLN: 0.0000 [IU] | Freq: Three times a day (TID) | SUBCUTANEOUS | Status: DC

## 2019-11-05 MED ORDER — ONDANSETRON HCL 4 MG/2ML IJ SOLN
4.0000 mg | Freq: Once | INTRAMUSCULAR | Status: AC
  Administered 2019-11-05: 10:00:00 4 mg via INTRAVENOUS
  Filled 2019-11-05: qty 2

## 2019-11-05 MED ORDER — ONDANSETRON 4 MG PO TBDP: 4.0000 mg | ORAL_TABLET | Freq: Three times a day (TID) | ORAL | 0 refills | Status: DC | PRN

## 2019-11-05 NOTE — H&P (Signed)
History and Physical    [name] [id number] DOB: 14-Jul-1966 DOA: 11/05/2019  PCP: Vicenta Aly, [place]  Patient coming from: Home  I have personally briefly reviewed patient's old medical records in [place]  Chief Complaint: Abdominal pain  HPI: Dale Terrell is a 54 y.o. male with medical history significant of hypertension, diabetes, hyperlipidemia, presents with a 5-day history of periumbilical abdominal pain.  Reports onset of symptoms occurring after he ate a meal.  He has had persistent nausea as well as emesis.  No fevers.  He has been having bowel movements.  No chest pain or shortness of breath.  On arrival to the emergency room, CT abdomen indicated acute cholecystitis with possible calculus in cystic duct.  LFTs were otherwise normal.  He was noted to be tachycardic on arrival which improved with IV fluids.  Blood pressure still remains on the low side.  Case was discussed with general surgery who felt patient would likely follow-up as an outpatient.  He received a dose of intravenous ciprofloxacin and may have had a reaction to this.  Admission has been requested.    Review of Systems:  General: Negative for fever, chills, weight loss, malaise Cardiac: Negative for chest pain, palpitations Respiratory: Negative for shortness of breath, cough, wheezing Abdomen: Positive for abdominal pain, nausea, vomiting, negative for diarrhea All other systems reviewed and found to be negative   Past Medical History:  Diagnosis Date  . Chronic back pain   . Depression    on zoloft  . Diabetes mellitus without complication ([place])   . Hyperlipidemia   . Hypertension   . Seizure ([place])    as a child, umknown etiology and on no meds ever, only had 1.    Past Surgical History:  Procedure Laterality Date  . COLONOSCOPY WITH PROPOFOL N/A 01/05/2019   Procedure: COLONOSCOPY WITH PROPOFOL;  Surgeon: Danie Binder, MD;  [facility];  Service: Endoscopy;   Laterality: N/A;  12:00pm  . cyst on arm Right   . ESOPHAGOGASTRODUODENOSCOPY (EGD) WITH PROPOFOL  01/05/2019   Procedure: ESOPHAGOGASTRODUODENOSCOPY (EGD) WITH PROPOFOL;  Surgeon: Danie Binder, MD;  Location: [facility];  Service: Endoscopy;;  . POLYPECTOMY  01/05/2019   Procedure: POLYPECTOMY;  Surgeon: Danie Binder, MD;  Location: [facility];  Service: Endoscopy;;    Social History:  reports that he has quit smoking. His smoking use included cigarettes. He has a 0.50 pack-year smoking history. He has never used smokeless tobacco. He reports previous alcohol use. He reports that he does not use drugs.  Allergies  Allergen Reactions  . Bee Venom Itching and Swelling  . Ciprofloxacin Hives and Itching    Family History  Problem Relation Age of Onset  . Colon cancer Neg Hx   . Colon polyps Neg Hx      Prior to Admission medications   Medication Sig Start Date End Date Taking? Authorizing Provider  aspirin EC 81 MG tablet Take 81 mg by mouth daily.   Yes [provider]  glimepiride (AMARYL) 2 MG tablet Take 2 mg by mouth daily.  08/05/18  Yes [provider]  lisinopril (PRINIVIL,ZESTRIL) 5 MG tablet Take 5 mg by mouth daily. 08/30/13  Yes [provider]  metFORMIN (GLUCOPHAGE) 1000 MG tablet Take 1,000 mg by mouth 2 (two) times daily with a meal.   Yes [provider]  omeprazole (PRILOSEC) 20 MG capsule 1 PO 30 MINS PRIOR TO BREAKFAST. 01/05/19  Yes  Fields, Sandi L, MD  simvastatin (ZOCOR) 40 MG tablet Take 40 mg by mouth at bedtime. 08/30/13  Yes [provider]  ciprofloxacin (CIPRO) 500 MG tablet Take 1 tablet (500 mg total) by mouth every 12 (twelve) hours. 11/05/19   Evalee Jefferson, PA-C  HYDROcodone-acetaminophen (NORCO/VICODIN) 5-325 MG tablet Take 1 tablet by mouth every 4 (four) hours as needed. 11/05/19   Evalee Jefferson, PA-C  metroNIDAZOLE (FLAGYL) 500 MG tablet Take 1 tablet (500 mg total) by mouth 2 (two) times daily. 11/05/19    Idol, Almyra Free, PA-C  ondansetron (ZOFRAN ODT) 4 MG disintegrating tablet Take 1 tablet (4 mg total) by mouth every 8 (eight) hours as needed for nausea or vomiting. 11/05/19   Evalee Jefferson, PA-C  tiZANidine (ZANAFLEX) 4 MG tablet One by mouth every 8 hours as needed for spasm Patient not taking: Reported on 11/05/2019 04/18/16   Sanjuana Kava, MD    Physical Exam: Vitals:   11/05/19 1332 11/05/19 1648 11/05/19 1727 11/05/19 1730  BP: 96/77 103/67 107/73 104/74  Pulse: 94 97 (!) 101 (!) 103  Resp: 20 18    Temp:  98.5 F (36.9 C)    TempSrc:  Oral    SpO2: 100% 98% 98% 97%  Weight:      Height:        Constitutional: NAD, calm, comfortable Eyes: PERRL, lids and conjunctivae normal ENMT: Mucous membranes are moist. Posterior pharynx clear of any exudate or lesions.Normal dentition.  Neck: normal, supple, no masses, no thyromegaly Respiratory: clear to auscultation bilaterally, no wheezing, no crackles. Normal respiratory effort. No accessory muscle use.  Cardiovascular: Regular rate and rhythm, no murmurs / rubs / gallops. No extremity edema. 2+ pedal pulses. No carotid bruits.  Abdomen: Tender more so in the left lower quadrant, no masses palpated. No hepatosplenomegaly. Bowel sounds positive.  Musculoskeletal: no clubbing / cyanosis. No joint deformity upper and lower extremities. Good ROM, no contractures. Normal muscle tone.  Skin: no rashes, lesions, ulcers. No induration Neurologic: CN 2-12 grossly intact. Sensation intact, DTR normal. Strength 5/5 in all 4.  Psychiatric: Normal judgment and insight. Alert and oriented x 3. Normal mood.    Labs on Admission: I have personally reviewed following labs and imaging studies  CBC: Recent Labs  Lab 11/05/19 1016  WBC 14.3*  NEUTROABS 12.3*  HGB 14.1  HCT 42.3  MCV 96.4  PLT 0000000   Basic Metabolic Panel: Recent Labs  Lab 11/05/19 1016  NA 132*  K 3.9  CL 98  CO2 25  GLUCOSE 131*  BUN 27*  CREATININE 0.98  CALCIUM 9.1    GFR: Estimated Creatinine Clearance: 92.1 mL/min (by C-G formula based on SCr of 0.98 mg/dL). Liver Function Tests: Recent Labs  Lab 11/05/19 1016  AST 19  ALT 19  ALKPHOS 43  BILITOT 1.1  PROT 8.0  ALBUMIN 3.5   Recent Labs  Lab 11/05/19 1016  LIPASE 25   No results for input(s): AMMONIA in the last 168 hours. Coagulation Profile: No results for input(s): INR, PROTIME in the last 168 hours. Cardiac Enzymes: No results for input(s): CKTOTAL, CKMB, CKMBINDEX, TROPONINI in the last 168 hours. BNP (last 3 results) No results for input(s): PROBNP in the last 8760 hours. HbA1C: No results for input(s): HGBA1C in the last 72 hours. CBG: Recent Labs  Lab 11/05/19 1104  GLUCAP 81   Lipid Profile: No results for input(s): CHOL, HDL, LDLCALC, TRIG, CHOLHDL, LDLDIRECT in the last 72 hours. Thyroid Function Tests: No results  for input(s): TSH, T4TOTAL, FREET4, T3FREE, THYROIDAB in the last 72 hours. Anemia Panel: No results for input(s): VITAMINB12, FOLATE, FERRITIN, TIBC, IRON, RETICCTPCT in the last 72 hours. Urine analysis:    Component Value Date/Time   COLORURINE AMBER (A) 11/05/2019 1015   APPEARANCEUR CLEAR 11/05/2019 1015   LABSPEC 1.021 11/05/2019 1015   PHURINE 5.0 11/05/2019 1015   GLUCOSEU NEGATIVE 11/05/2019 1015   HGBUR SMALL (A) 11/05/2019 1015   BILIRUBINUR NEGATIVE 11/05/2019 1015   KETONESUR NEGATIVE 11/05/2019 1015   PROTEINUR 30 (A) 11/05/2019 1015   UROBILINOGEN 1.0 09/19/2013 1609   NITRITE NEGATIVE 11/05/2019 1015   LEUKOCYTESUR NEGATIVE 11/05/2019 1015    Radiological Exams on Admission: CT ABDOMEN PELVIS W CONTRAST  Result Date: 11/05/2019 CLINICAL DATA:  Left upper quadrant abdominal pain. Nausea and vomiting and diarrhea. EXAM: CT ABDOMEN AND PELVIS WITH CONTRAST TECHNIQUE: Multidetector CT imaging of the abdomen and pelvis was performed using the standard protocol following bolus administration of intravenous contrast. CONTRAST:  111mL  OMNIPAQUE IOHEXOL 300 MG/ML  SOLN COMPARISON:  None. FINDINGS: Lower chest: Normal. Hepatobiliary: There is marked distention of the gallbladder with soft tissue stranding around the gallbladder with a small amount of pericholecystic fluid. Numerous tiny stones in the gallbladder. There is a stone in the cystic duct likely obstructing the duct. No dilated bile ducts. There are 2 loculated areas of fluid along the inferior margin of the liver adjacent a to the gallbladder. These collections appear to be subcapsular and are felt to be related to the adjacent cholecystitis. Pancreas: Unremarkable. No pancreatic ductal dilatation or surrounding inflammatory changes. Spleen: Normal in size without focal abnormality. Adrenals/Urinary Tract: Adrenal glands are unremarkable. Kidneys are normal, without renal calculi, focal lesion, or hydronephrosis. Bladder is unremarkable. Stomach/Bowel: Stomach is within normal limits. Appendix appears normal. No evidence of bowel wall thickening, distention, or inflammatory changes. Vascular/Lymphatic: No significant vascular findings are present. No enlarged abdominal or pelvic lymph nodes. Reproductive: Prostate is unremarkable. Other: No abdominal wall hernia or abnormality. No abdominopelvic ascites. Musculoskeletal: No acute abnormalities. Chronic deformity of the left femoral head and neck with secondary deformity of the acetabulum, likely representing congenital hip dysplasia and secondary arthritis. IMPRESSION: 1. Acute cholecystitis with a stone in the cystic duct likely obstructing the duct. 2. Two loculated areas of fluid along the inferior margin of the liver adjacent to the gallbladder. These are felt to be related to the adjacent cholecystitis. 3. Chronic deformity of the left femoral head and neck with secondary deformity of the acetabulum, likely representing congenital hip dysplasia and secondary arthritis. Electronically Signed   By: Lorriane Shire M.D.   On: 11/05/2019  11:48     Assessment/Plan Active Problems:   Acute cholecystitis   HTN (hypertension)   Type 2 diabetes mellitus with hyperlipidemia (HCC)   GERD (gastroesophageal reflux disease)     1. Acute calculus cholecystitis.  Pain appears to be reasonably controlled.  Start the patient on clear liquids.  Since he had a reaction to ciprofloxacin, will treat with Zosyn.  General surgery to see in a.m.  If he is stable, anticipate discharge with outpatient surgical follow-up. 2. Diabetes.  Hold oral agents.  Start on sliding scale insulin. 3. GERD.  Continue on PPI 4. Hypertension.  Hold lisinopril since blood pressures have been soft.  DVT prophylaxis: Lovenox Code Status: Full code Family Communication: Discussed with patient Disposition Plan: Discharge home in a.m. if abdominal pain is stable unable to keep down p.o. Consults called: General surgery  Admission status: Observation, MedSurg  Kathie Dike MD [facility]   If 7PM-7AM, please contact night-coverage [email or web address]   11/05/2019, 6:18 PM

## 2019-11-05 NOTE — ED Provider Notes (Signed)
[facility] EMERGENCY DEPARTMENT Provider Note   CSN: [id number] Arrival date & time: 11/05/19  [id number]     History No chief complaint on file.   Dale Terrell is a 54 y.o. male with history significant for diabetes, hypertension and hyperlipidemia presenting with a 5-day history of periumbilical abdominal pain.  He reports eating a [place] dish earlier in the day and by that evening had developed periumbilical pain.  He has had persistent nausea with occasional episodes of emesis last episode occurring yesterday evening.  He denies fevers or chills.  He denies abdominal distention.  He has been passing flatus and states his last normal bowel movement was this morning before arrival.  However since arriving here he had a diarrheal episode.  This did not affect his pain symptoms however.  He denies chest pain or shortness of breath.  He reports having "no taste" for food but has been pushing fluids.  His last CBG check was this morning and around 130.  He has noted mild dysuria but with reduced urinary frequency since yesterday.  He has had no medications prior to arrival for his symptoms.  No Covid contacts.  Denies any respiratory symptoms.  HPI     Past Medical History:  Diagnosis Date  . Chronic back pain   . Depression    on zoloft  . Diabetes mellitus without complication ([place])   . Hyperlipidemia   . Hypertension   . Seizure ([place])    as a child, umknown etiology and on no meds ever, only had 1.    Patient Active Problem List   Diagnosis Date Noted  . Heme positive stool 09/30/2018  . Hypoglycemia 09/20/2013    Past Surgical History:  Procedure Laterality Date  . COLONOSCOPY WITH PROPOFOL N/A 01/05/2019   Procedure: COLONOSCOPY WITH PROPOFOL;  Surgeon: Danie Binder, MD;  [facility];  Service: Endoscopy;  Laterality: N/A;  12:00pm  . cyst on arm Right   . ESOPHAGOGASTRODUODENOSCOPY (EGD) WITH PROPOFOL  01/05/2019   Procedure: ESOPHAGOGASTRODUODENOSCOPY (EGD)  WITH PROPOFOL;  Surgeon: Danie Binder, MD;  [facility];  Service: Endoscopy;;  . POLYPECTOMY  01/05/2019   Procedure: POLYPECTOMY;  Surgeon: Danie Binder, MD;  [facility];  Service: Endoscopy;;       Family History  Problem Relation Age of Onset  . Colon cancer Neg Hx   . Colon polyps Neg Hx     Social History   Tobacco Use  . Smoking status: Former Smoker    Packs/day: 0.25    Years: 2.00    Pack years: 0.50    Types: Cigarettes  . Smokeless tobacco: Never Used  Substance Use Topics  . Alcohol use: Not Currently    Comment: in the past  . Drug use: No    Home Medications Prior to Admission medications   Medication Sig Start Date End Date Taking? Authorizing Provider  acetaminophen (TYLENOL) 500 MG tablet Take 500 mg by mouth every 6 (six) hours as needed for moderate pain.    [provider]  aspirin EC 81 MG tablet Take 81 mg by mouth daily.    [provider]  ciprofloxacin (CIPRO) 500 MG tablet Take 1 tablet (500 mg total) by mouth every 12 (twelve) hours. 11/05/19   Evalee Jefferson, PA-C  glimepiride (AMARYL) 2 MG tablet Take 2 mg by mouth daily.  08/05/18   [provider]  HYDROcodone-acetaminophen (NORCO/VICODIN) 5-325 MG tablet Take 1 tablet by mouth  every 4 (four) hours as needed. 11/05/19   Evalee Jefferson, PA-C  lisinopril (PRINIVIL,ZESTRIL) 5 MG tablet Take 5 mg by mouth daily. 08/30/13   [provider]  metFORMIN (GLUCOPHAGE) 1000 MG tablet Take 1,000 mg by mouth 2 (two) times daily with a meal.    [provider]  metroNIDAZOLE (FLAGYL) 500 MG tablet Take 1 tablet (500 mg total) by mouth 2 (two) times daily. 11/05/19   Evalee Jefferson, PA-C  omeprazole (PRILOSEC) 20 MG capsule 1 PO 30 MINS PRIOR TO BREAKFAST. 01/05/19   Fields, Marga Melnick, MD  ondansetron (ZOFRAN ODT) 4 MG disintegrating tablet Take 1 tablet (4 mg total) by mouth every 8 (eight) hours as needed for nausea or vomiting. 11/05/19   Evalee Jefferson,  PA-C  sertraline (ZOLOFT) 100 MG tablet Take 100 mg by mouth daily.  04/20/15   [provider]  simvastatin (ZOCOR) 40 MG tablet Take 40 mg by mouth at bedtime. 08/30/13   [provider]  tiZANidine (ZANAFLEX) 4 MG tablet One by mouth every 8 hours as needed for spasm Patient taking differently: Take 4 mg by mouth every 8 (eight) hours as needed for muscle spasms.  04/18/16   Sanjuana Kava, MD    Allergies    Bee venom and Ciprofloxacin  Review of Systems   Review of Systems  Constitutional: Negative for chills and fever.  HENT: Negative for congestion and sore throat.   Eyes: Negative.   Respiratory: Negative for chest tightness and shortness of breath.   Cardiovascular: Negative for chest pain.  Gastrointestinal: Positive for abdominal pain, diarrhea, nausea and vomiting.  Genitourinary: Positive for decreased urine volume and dysuria.  Musculoskeletal: Negative for arthralgias, joint swelling and neck pain.  Skin: Negative.  Negative for rash and wound.  Neurological: Negative for dizziness, weakness, light-headedness, numbness and headaches.  Psychiatric/Behavioral: Negative.     Physical Exam Updated Vital Signs BP 96/77 (BP Location: Left Arm)   Pulse 94   Temp 98.4 F (36.9 C) (Oral)   Resp 20   Ht 5\' 7"  (1.702 m)   Wt 89.8 kg   SpO2 100%   BMI 31.01 kg/m   Physical Exam Vitals and nursing note reviewed.  Constitutional:      Appearance: He is well-developed.  HENT:     Head: Normocephalic and atraumatic.  Eyes:     Conjunctiva/sclera: Conjunctivae normal.  Cardiovascular:     Rate and Rhythm: Normal rate and regular rhythm.     Heart sounds: Normal heart sounds.  Pulmonary:     Effort: Pulmonary effort is normal.     Breath sounds: Normal breath sounds. No wheezing.  Abdominal:     General: Abdomen is protuberant. Bowel sounds are normal.     Palpations: Abdomen is soft.     Tenderness: There is abdominal tenderness in the periumbilical  area. There is no guarding or rebound.  Musculoskeletal:        General: Normal range of motion.     Cervical back: Normal range of motion.  Skin:    General: Skin is warm and dry.  Neurological:     Mental Status: He is alert.     ED Results / Procedures / Treatments   Labs (all labs ordered are listed, but only abnormal results are displayed) Labs Reviewed  COMPREHENSIVE METABOLIC PANEL - Abnormal; Notable for the following components:      Result Value   Sodium 132 (*)    Glucose, Bld 131 (*)  BUN 27 (*)    All other components within normal limits  URINALYSIS, ROUTINE W REFLEX MICROSCOPIC - Abnormal; Notable for the following components:   Color, Urine AMBER (*)    Hgb urine dipstick SMALL (*)    Protein, ur 30 (*)    All other components within normal limits  CBC WITH DIFFERENTIAL/PLATELET - Abnormal; Notable for the following components:   WBC 14.3 (*)    Neutro Abs 12.3 (*)    All other components within normal limits  RESPIRATORY PANEL BY RT PCR (FLU A&B, COVID)  CULTURE, BLOOD (ROUTINE X 2)  CULTURE, BLOOD (ROUTINE X 2)  LIPASE, BLOOD  LACTIC ACID, PLASMA  CBG MONITORING, ED    EKG None  Radiology CT ABDOMEN PELVIS W CONTRAST  Result Date: 11/05/2019 CLINICAL DATA:  Left upper quadrant abdominal pain. Nausea and vomiting and diarrhea. EXAM: CT ABDOMEN AND PELVIS WITH CONTRAST TECHNIQUE: Multidetector CT imaging of the abdomen and pelvis was performed using the standard protocol following bolus administration of intravenous contrast. CONTRAST:  120mL OMNIPAQUE IOHEXOL 300 MG/ML  SOLN COMPARISON:  None. FINDINGS: Lower chest: Normal. Hepatobiliary: There is marked distention of the gallbladder with soft tissue stranding around the gallbladder with a small amount of pericholecystic fluid. Numerous tiny stones in the gallbladder. There is a stone in the cystic duct likely obstructing the duct. No dilated bile ducts. There are 2 loculated areas of fluid along the  inferior margin of the liver adjacent a to the gallbladder. These collections appear to be subcapsular and are felt to be related to the adjacent cholecystitis. Pancreas: Unremarkable. No pancreatic ductal dilatation or surrounding inflammatory changes. Spleen: Normal in size without focal abnormality. Adrenals/Urinary Tract: Adrenal glands are unremarkable. Kidneys are normal, without renal calculi, focal lesion, or hydronephrosis. Bladder is unremarkable. Stomach/Bowel: Stomach is within normal limits. Appendix appears normal. No evidence of bowel wall thickening, distention, or inflammatory changes. Vascular/Lymphatic: No significant vascular findings are present. No enlarged abdominal or pelvic lymph nodes. Reproductive: Prostate is unremarkable. Other: No abdominal wall hernia or abnormality. No abdominopelvic ascites. Musculoskeletal: No acute abnormalities. Chronic deformity of the left femoral head and neck with secondary deformity of the acetabulum, likely representing congenital hip dysplasia and secondary arthritis. IMPRESSION: 1. Acute cholecystitis with a stone in the cystic duct likely obstructing the duct. 2. Two loculated areas of fluid along the inferior margin of the liver adjacent to the gallbladder. These are felt to be related to the adjacent cholecystitis. 3. Chronic deformity of the left femoral head and neck with secondary deformity of the acetabulum, likely representing congenital hip dysplasia and secondary arthritis. Electronically Signed   By: Lorriane Shire M.D.   On: 11/05/2019 11:48    Procedures Procedures (including critical care time)  Medications Ordered in ED Medications  piperacillin-tazobactam (ZOSYN) IVPB 3.375 g (has no administration in time range)  sodium chloride flush (NS) 0.9 % injection 3 mL (3 mLs Intravenous Given by Other 11/05/19 1013)  ondansetron (ZOFRAN) injection 4 mg (4 mg Intravenous Given 11/05/19 1026)  sodium chloride 0.9 % bolus 1,000 mL (0 mLs  Intravenous Stopped 11/05/19 1151)  iohexol (OMNIPAQUE) 300 MG/ML solution 100 mL (100 mLs Intravenous Contrast Given 11/05/19 1110)  ciprofloxacin (CIPRO) IVPB 400 mg (0 mg Intravenous Stopped 11/05/19 1332)  diphenhydrAMINE (BENADRYL) injection 25 mg (25 mg Intravenous Given 11/05/19 1337)  sodium chloride 0.9 % bolus 1,000 mL (1,000 mLs Intravenous New Bag/Given 11/05/19 1351)    ED Course  I have reviewed the triage vital  signs and the nursing notes.  Pertinent labs & imaging results that were available during my care of the patient were reviewed by me and considered in my medical decision making (see chart for details).    MDM Rules/Calculators/A&P                      Pt was given Zofran with resolution of nausea, although felt a little nauseated while undergoing CT imaging.  Pain 2/10, tolerable.     Lab results and imaging reviewed and interpreted. He does have an elevation in his wbc count, but normal LFT's and lipase.  CT imaging positive for acute cholecystitis, suspected cystic duct stone, no CBD dilation.  Discussed pt with Dr. Arnoldo Morale including sx, labs and imaging results. Recommended home tx with Cipro, zofran, pain medication.  Will plan OV on Tuesday 1/12 1:15 with return precautions for increased sx.  Cipro IV dose given here.  Also evaluated for PO intake which he tolerated.    Discussed with Dr. Ashok Cordia - will also add flagyl to abx regimen.  Discussed with pt strict return precautions for worse pain, n/v, development of fever or weakness.  Discussed low fat diet to help avoid acute flare.    2:49 PM Pt given IV fluids secondary to suspected dehydration with tachycardia upon first arrival.  Repeat VS pulse 94, but bp now 96/77.   Added additional IV bolus.  Remains afebrile, but has elevated wbc count so does meet Sirs criteria. Lactate added.  During IV infusion of cipro, became itchy, redness to IV arm. Benadryl added. No sob, no wheezing, no facial or mouth swelling, cipro  stopped.   Will advise Dr. Arnoldo Morale of update and recommend admission. I am concerned this pt will not do well if sent home today.  Additional IV fluids being given,  Zosyn ordered.  Lactate normal range.  Dr. Arnoldo Morale will follow pt - will plan to see in am.  He may have clear liquid diet.  Will not plan to take to OR during this admission if sx remain stable and LFT's/lipase remains normal.   2:49 PM Consult requested for hospitalist admission. Discussed with Dr. Roderic Palau who agrees with admission.    Final Clinical Impression(s) / ED Diagnoses Final diagnoses:  Acute cholecystitis    Rx / DC Orders ED Discharge Orders         Ordered    ciprofloxacin (CIPRO) 500 MG tablet  Every 12 hours     11/05/19 1258    metroNIDAZOLE (FLAGYL) 500 MG tablet  2 times daily     11/05/19 1258    HYDROcodone-acetaminophen (NORCO/VICODIN) 5-325 MG tablet  Every 4 hours PRN     11/05/19 1258    ondansetron (ZOFRAN ODT) 4 MG disintegrating tablet  Every 8 hours PRN     11/05/19 1258           Evalee Jefferson, PA-C 11/05/19 1449    Lajean Saver, MD 11/07/19 [phone]

## 2019-11-05 NOTE — ED Triage Notes (Addendum)
Pt c/o nausea, vomiting, abdominal pain since Monday. Diarrhea that started today. Denies known fever.

## 2019-11-05 NOTE — Progress Notes (Signed)
Pharmacy Antibiotic Note  Dale Terrell is a 54 y.o. male admitted on 11/05/2019 with intra-abdominal infection.  Pharmacy has been consulted for Zosyn dosing.  Patient admitted with periumbilical pain with N/V. CT on 1/8 shows acute cholecystitis with a stone likely obstructing cystic duct and associated fluid along the liver. Patient is afebrile with a Tmax of 98.5. WBC count is elevated at 14.3. Zosyn one time dose given at 1530.   Plan: Zosyn 3.375g IV q8h (4 hour infusion). Start 8 hours from last dose (2300) Follow cultures, clinical improvement, renal function, and length of therapy   Height: 5\' 7"  (170.2 cm) Weight: 197 lb 15.9 oz (89.8 kg) IBW/kg (Calculated) : 66.1  Temp (24hrs), Avg:98.5 F (36.9 C), Min:98.4 F (36.9 C), Max:98.5 F (36.9 C)  Recent Labs  Lab 11/05/19 1016 11/05/19 1401  WBC 14.3*  --   CREATININE 0.98  --   LATICACIDVEN  --  1.0    Estimated Creatinine Clearance: 92.1 mL/min (by C-G formula based on SCr of 0.98 mg/dL).    Allergies  Allergen Reactions  . Bee Venom Itching and Swelling  . Ciprofloxacin Hives and Itching    Antimicrobials this admission: cipro x1 1/8  zosyn 1/8 >>   Microbiology results: 1/8 BCx: pending   Thank you for allowing pharmacy to be a part of this patient's care.  Eddie Candle, PharmD PGY-1 Pharmacy Resident   Please check amion for clinical pharmacist contact number   11/05/2019 6:18 PM

## 2019-11-05 NOTE — ED Notes (Signed)
Pt tolerated water with no difficulties. Pt denies nausea.

## 2019-11-05 NOTE — ED Notes (Signed)
Patient transported to CT 

## 2019-11-06 DIAGNOSIS — K810 Acute cholecystitis: Secondary | ICD-10-CM | POA: Diagnosis not present

## 2019-11-06 LAB — CBC
HCT: 35.7 % — ABNORMAL LOW (ref 39.0–52.0)
Hemoglobin: 11.7 g/dL — ABNORMAL LOW (ref 13.0–17.0)
MCH: 32.2 pg (ref 26.0–34.0)
MCHC: 32.8 g/dL (ref 30.0–36.0)
MCV: 98.3 fL (ref 80.0–100.0)
Platelets: 250 10*3/uL (ref 150–400)
RBC: 3.63 MIL/uL — ABNORMAL LOW (ref 4.22–5.81)
RDW: 12.7 % (ref 11.5–15.5)
WBC: 11.3 10*3/uL — ABNORMAL HIGH (ref 4.0–10.5)
nRBC: 0 % (ref 0.0–0.2)

## 2019-11-06 LAB — COMPREHENSIVE METABOLIC PANEL
ALT: 23 U/L (ref 0–44)
AST: 25 U/L (ref 15–41)
Albumin: 2.9 g/dL — ABNORMAL LOW (ref 3.5–5.0)
Alkaline Phosphatase: 39 U/L (ref 38–126)
Anion gap: 5 (ref 5–15)
BUN: 14 mg/dL (ref 6–20)
CO2: 27 mmol/L (ref 22–32)
Calcium: 8.1 mg/dL — ABNORMAL LOW (ref 8.9–10.3)
Chloride: 100 mmol/L (ref 98–111)
Creatinine, Ser: 0.8 mg/dL (ref 0.61–1.24)
GFR calc Af Amer: >60 mL/min (ref >60)
GFR calc non Af Amer: >60 mL/min (ref >60)
Glucose, Bld: 44 mg/dL — CL (ref 70–99)
Potassium: 3.4 mmol/L — ABNORMAL LOW (ref 3.5–5.1)
Sodium: 132 mmol/L — ABNORMAL LOW (ref 135–145)
Total Bilirubin: 1 mg/dL (ref 0.3–1.2)
Total Protein: 6.7 g/dL (ref 6.5–8.1)

## 2019-11-06 LAB — CBG MONITORING, ED
Glucose-Capillary: 156 mg/dL — ABNORMAL HIGH (ref 70–99)
Glucose-Capillary: 53 mg/dL — ABNORMAL LOW (ref 70–99)
Glucose-Capillary: 47 mg/dL — ABNORMAL LOW (ref 70–99)
Glucose-Capillary: 99 mg/dL (ref 70–99)

## 2019-11-06 LAB — HIV ANTIBODY (ROUTINE TESTING W REFLEX): HIV Screen 4th Generation wRfx: NONREACTIVE

## 2019-11-06 MED ORDER — POTASSIUM CHLORIDE CRYS ER 20 MEQ PO TBCR
40.0000 meq | EXTENDED_RELEASE_TABLET | Freq: Once | ORAL | Status: DC
  Filled 2019-11-06: qty 2

## 2019-11-06 MED ORDER — OXYCODONE-ACETAMINOPHEN 7.5-325 MG PO TABS: 1.0000 | ORAL_TABLET | Freq: Four times a day (QID) | ORAL | 0 refills | Status: DC | PRN

## 2019-11-06 MED ORDER — DEXTROSE 50 % IV SOLN
INTRAVENOUS | Status: AC
  Filled 2019-11-06: qty 50

## 2019-11-06 MED ORDER — DEXTROSE 50 % IV SOLN
50.0000 mL | Freq: Once | INTRAVENOUS | Status: AC
  Administered 2019-11-06: 05:00:00 50 mL via INTRAVENOUS
  Filled 2019-11-06: qty 50

## 2019-11-06 MED ORDER — DEXTROSE 50 % IV SOLN
1.0000 | Freq: Once | INTRAVENOUS | Status: AC
  Administered 2019-11-06: 50 mL via INTRAVENOUS

## 2019-11-06 MED ORDER — DEXTROSE 50 % IV SOLN
25.0000 g | INTRAVENOUS | Status: AC
  Administered 2019-11-06: 25 g via INTRAVENOUS

## 2019-11-06 MED ORDER — ONDANSETRON HCL 4 MG PO TABS: 4.0000 mg | ORAL_TABLET | Freq: Three times a day (TID) | ORAL | 1 refills | Status: DC | PRN

## 2019-11-06 MED ORDER — DEXTROSE-NACL 5-0.45 % IV SOLN: INTRAVENOUS | Status: DC

## 2019-11-06 NOTE — ED Notes (Signed)
Notified Dr Arnoldo Morale r/t pt's cbg, vo to give 1 amp d50 ,food.  Recheck cbg x 1 hour if stable pt can be discharged and instructed to hold oral hyperglycemic medications.

## 2019-11-06 NOTE — ED Notes (Signed)
RN requested D5 1/2 Saline from [facility] to replenish ED Floor stock. Pt requires change in fluids per MD order.

## 2019-11-06 NOTE — Discharge Instructions (Signed)
Laparoscopic Cholecystectomy Laparoscopic cholecystectomy is surgery to remove the gallbladder. The gallbladder is a pear-shaped organ that lies beneath the liver on the right side of the body. The gallbladder stores bile, which is a fluid that helps the body to digest fats. Cholecystectomy is often done for inflammation of the gallbladder (cholecystitis). This condition is usually caused by a buildup of gallstones (cholelithiasis) in the gallbladder. Gallstones can block the flow of bile, which can result in inflammation and pain. In severe cases, emergency surgery may be required. This procedure is done though small incisions in your abdomen (laparoscopic surgery). A thin scope with a camera (laparoscope) is inserted through one incision. Thin surgical instruments are inserted through the other incisions. In some cases, a laparoscopic procedure may be turned into a type of surgery that is done through a larger incision (open surgery). Tell a health care provider about:  Any allergies you have.  All medicines you are taking, including vitamins, herbs, eye drops, creams, and over-the-counter medicines.  Any problems you or family members have had with anesthetic medicines.  Any blood disorders you have.  Any surgeries you have had.  Any medical conditions you have.  Whether you are pregnant or may be pregnant. What are the risks? Generally, this is a safe procedure. However, problems may occur, including:  Infection.  Bleeding.  Allergic reactions to medicines.  Damage to other structures or organs.  A stone remaining in the common bile duct. The common bile duct carries bile from the gallbladder into the small intestine.  A bile leak from the cyst duct that is clipped when your gallbladder is removed. What happens before the procedure?   Medicines  Ask your health care provider about: ? Changing or stopping your regular medicines. This is especially important if you are taking  diabetes medicines or blood thinners. ? Taking medicines such as aspirin and ibuprofen. These medicines can thin your blood. Do not take these medicines before your procedure if your health care provider instructs you not to.  You may be given antibiotic medicine to help prevent infection. General instructions  Let your health care provider know if you develop a cold or an infection before surgery.  Plan to have someone take you home from the hospital or clinic.  Ask your health care provider how your surgical site will be marked or identified. What happens during the procedure?   To reduce your risk of infection: ? Your health care team will wash or sanitize their hands. ? Your skin will be washed with soap. ? Hair may be removed from the surgical area.  An IV tube may be inserted into one of your veins.  You will be given one or more of the following: ? A medicine to help you relax (sedative). ? A medicine to make you fall asleep (general anesthetic).  A breathing tube will be placed in your mouth.  Your surgeon will make several small cuts (incisions) in your abdomen.  The laparoscope will be inserted through one of the small incisions. The camera on the laparoscope will send images to a TV screen (monitor) in the operating room. This lets your surgeon see inside your abdomen.  Air-like gas will be pumped into your abdomen. This will expand your abdomen to give the surgeon more room to perform the surgery.  Other tools that are needed for the procedure will be inserted through the other incisions. The gallbladder will be removed through one of the incisions.  Your common bile duct   may be examined. If stones are found in the common bile duct, they may be removed.  After your gallbladder has been removed, the incisions will be closed with stitches (sutures), staples, or skin glue.  Your incisions may be covered with a bandage (dressing). The procedure may vary among health  care providers and hospitals. What happens after the procedure?  Your blood pressure, heart rate, breathing rate, and blood oxygen level will be monitored until the medicines you were given have worn off.  You will be given medicines as needed to control your pain.  Do not drive for 24 hours if you were given a sedative. This information is not intended to replace advice given to you by your health care provider. Make sure you discuss any questions you have with your health care provider. Document Revised: 09/26/2017 Document Reviewed: 04/01/2016 Elsevier Patient Education  2020 [employer]vier Inc.  

## 2019-11-06 NOTE — ED Notes (Signed)
Date and time results received: 11/06/19 0519   Test: Blood Glucose Critical Value: 39  Name of Provider Notified: Darrick Meigs, MD

## 2019-11-06 NOTE — H&P (View-Only) (Signed)
Reason for Consult: Cholecystitis, cholelithiasis Referring Physician: Dr. Cecilio Asper KASTIEL GADBOIS is an 54 y.o. male.  HPI: Patient is a 54 year old white male who presented to the emergency room on 11/05/2019 with worsening right upper quadrant abdominal pain, nausea, and vomiting.  CT scan of the abdomen revealed cholecystitis with cholelithiasis.  He states the episode started approximately 4 days earlier.  He has had intermittent episodes of epigastric pain and right upper quadrant abdominal pain radiating to his right flank.  He has had intermittent nausea.  He denies any fever, chills, jaundice.  In the emergency room, he was started on IV Zosyn and pain medication.  This morning, he states he does feel better.  He denies any nausea or vomiting.  Past Medical History:  Diagnosis Date  . Chronic back pain   . Depression    on zoloft  . Diabetes mellitus without complication ([place])   . Hyperlipidemia   . Hypertension   . Seizure ([place])    as a child, umknown etiology and on no meds ever, only had 1.    Past Surgical History:  Procedure Laterality Date  . COLONOSCOPY WITH PROPOFOL N/A 01/05/2019   Procedure: COLONOSCOPY WITH PROPOFOL;  Surgeon: Danie Binder, MD;  [facility];  Service: Endoscopy;  Laterality: N/A;  12:00pm  . cyst on arm Right   . ESOPHAGOGASTRODUODENOSCOPY (EGD) WITH PROPOFOL  01/05/2019   Procedure: ESOPHAGOGASTRODUODENOSCOPY (EGD) WITH PROPOFOL;  Surgeon: Danie Binder, MD;  [facility];  Service: Endoscopy;;  . POLYPECTOMY  01/05/2019   Procedure: POLYPECTOMY;  Surgeon: Danie Binder, MD;  [facility];  Service: Endoscopy;;    Family History  Problem Relation Age of Onset  . Colon cancer Neg Hx   . Colon polyps Neg Hx     Social History:  reports that he has quit smoking. His smoking use included cigarettes. He has a 0.50 pack-year smoking history. He has never used smokeless tobacco. He reports previous alcohol use. He  reports that he does not use drugs.  Allergies:  Allergies  Allergen Reactions  . Bee Venom Itching and Swelling  . Ciprofloxacin Hives and Itching    Medications: Prior to Admission: (Not in a hospital admission)   Results for orders placed or performed during the hospital encounter of 11/05/19 (from the past 48 hour(s))  Urinalysis, Routine w reflex microscopic     Status: Abnormal   Collection Time: 11/05/19 10:15 AM  Result Value Ref Range   Color, Urine AMBER (A) YELLOW    Comment: BIOCHEMICALS MAY BE AFFECTED BY COLOR   APPearance CLEAR CLEAR   Specific Gravity, Urine 1.021 1.005 - 1.030   pH 5.0 5.0 - 8.0   Glucose, UA NEGATIVE NEGATIVE mg/dL   Hgb urine dipstick SMALL (A) NEGATIVE   Bilirubin Urine NEGATIVE NEGATIVE   Ketones, ur NEGATIVE NEGATIVE mg/dL   Protein, ur 30 (A) NEGATIVE mg/dL   Nitrite NEGATIVE NEGATIVE   Leukocytes,Ua NEGATIVE NEGATIVE   RBC / HPF 0-5 0 - 5 RBC/hpf   WBC, UA 0-5 0 - 5 WBC/hpf   Bacteria, UA NONE SEEN NONE SEEN   Squamous Epithelial / LPF 0-5 0 - 5   Mucus PRESENT     Comment: Performed at [facility], [address], [place], [place] [address]  Lipase, blood     Status: None   Collection Time: 11/05/19 10:16 AM  Result Value Ref Range   Lipase 25 11 - 51 U/L  Comment: Performed at [facility], [address] [place], [place] [address]  Comprehensive metabolic panel     Status: Abnormal   Collection Time: 11/05/19 10:16 AM  Result Value Ref Range   Sodium 132 (L) 135 - 145 mmol/L   Potassium 3.9 3.5 - 5.1 mmol/L   Chloride 98 98 - 111 mmol/L   CO2 25 22 - 32 mmol/L   Glucose, Bld 131 (H) 70 - 99 mg/dL   BUN 27 (H) 6 - 20 mg/dL   Creatinine, Ser 0.98 0.61 - 1.24 mg/dL   Calcium 9.1 8.9 - 10.3 mg/dL   Total Protein 8.0 6.5 - 8.1 g/dL   Albumin 3.5 3.5 - 5.0 g/dL   AST 19 15 - 41 U/L   ALT 19 0 - 44 U/L   Alkaline Phosphatase 43 38 - 126 U/L   Total Bilirubin 1.1 0.3 - 1.2 mg/dL   GFR calc non Af Amer >60 >60 mL/min   GFR  calc Af Amer >60 >60 mL/min   Anion gap 9 5 - 15    Comment: Performed at [facility], [address] [place], [place] [address]  CBC with Differential     Status: Abnormal   Collection Time: 11/05/19 10:16 AM  Result Value Ref Range   WBC 14.3 (H) 4.0 - 10.5 K/uL   RBC 4.39 4.22 - 5.81 MIL/uL   Hemoglobin 14.1 13.0 - 17.0 g/dL   HCT 42.3 39.0 - 52.0 %   MCV 96.4 80.0 - 100.0 fL   MCH 32.1 26.0 - 34.0 pg   MCHC 33.3 30.0 - 36.0 g/dL   RDW 12.7 11.5 - 15.5 %   Platelets 249 150 - 400 K/uL   nRBC 0.0 0.0 - 0.2 %   Neutrophils Relative % 86 %   Neutro Abs 12.3 (H) 1.7 - 7.7 K/uL   Lymphocytes Relative 10 %   Lymphs Abs 1.4 0.7 - 4.0 K/uL   Monocytes Relative 3 %   Monocytes Absolute 0.4 0.1 - 1.0 K/uL   Eosinophils Relative 1 %   Eosinophils Absolute 0.1 0.0 - 0.5 K/uL   Basophils Relative 0 %   Basophils Absolute 0.0 0.0 - 0.1 K/uL   Immature Granulocytes 0 %   Abs Immature Granulocytes 0.05 0.00 - 0.07 K/uL    Comment: Performed at [facility], [address] [place], [place] [address]  POC CBG, ED     Status: None   Collection Time: 11/05/19 11:04 AM  Result Value Ref Range   Glucose-Capillary 81 70 - 99 mg/dL  Respiratory Panel by RT PCR (Flu A&B, Covid) - Nasopharyngeal Swab     Status: None   Collection Time: 11/05/19  1:15 PM   Specimen: Nasopharyngeal Swab  Result Value Ref Range   SARS Coronavirus 2 by RT PCR NEGATIVE NEGATIVE    Comment: (NOTE) SARS-CoV-2 target nucleic acids are NOT DETECTED. The SARS-CoV-2 RNA is generally detectable in upper respiratoy specimens during the acute phase of infection. The lowest concentration of SARS-CoV-2 viral copies this assay can detect is 131 copies/mL. A negative result does not preclude SARS-Cov-2 infection and should not be used as the sole basis for treatment or other patient management decisions. A negative result may occur with  improper specimen collection/handling, submission of specimen other than nasopharyngeal  swab, presence of viral mutation(s) within the areas targeted by this assay, and inadequate number of viral copies (<131 copies/mL). A negative result must be combined with clinical observations, patient history, and epidemiological  information. The expected result is Negative. Fact Sheet for Patients:  PinkCheek.be Fact Sheet for Healthcare Providers:  GravelBags.it This test is not yet ap proved or cleared by the [place] FDA and  has been authorized for detection and/or diagnosis of SARS-CoV-2 by FDA under an Emergency Use Authorization (EUA). This EUA will remain  in effect (meaning this test can be used) for the duration of the COVID-19 declaration under Section 564(b)(1) of the Act, 21 U.S.C. section 360bbb-3(b)(1), unless the authorization is terminated or revoked sooner.    Influenza A by PCR NEGATIVE NEGATIVE   Influenza B by PCR NEGATIVE NEGATIVE    Comment: (NOTE) The Xpert Xpress SARS-CoV-2/FLU/RSV assay is intended as an aid in  the diagnosis of influenza from Nasopharyngeal swab specimens and  should not be used as a sole basis for treatment. Nasal washings and  aspirates are unacceptable for Xpert Xpress SARS-CoV-2/FLU/RSV  testing. Fact Sheet for Patients: PinkCheek.be Fact Sheet for Healthcare Providers: GravelBags.it This test is not yet approved or cleared by the [place] FDA and  has been authorized for detection and/or diagnosis of SARS-CoV-2 by  FDA under an Emergency Use Authorization (EUA). This EUA will remain  in effect (meaning this test can be used) for the duration of the  Covid-19 declaration under Section 564(b)(1) of the Act, 21  U.S.C. section 360bbb-3(b)(1), unless the authorization is  terminated or revoked. Performed at [facility], [address] [place], [place] [address]   Lactate     Status: None   Collection Time:  11/05/19  2:01 PM  Result Value Ref Range   Lactic Acid, Venous 1.0 0.5 - 1.9 mmol/L    Comment: Performed at [facility], [address] [place], [place] [address]  Blood culture (routine x 2)     Status: None (Preliminary result)   Collection Time: 11/05/19  3:07 PM   Specimen: BLOOD  Result Value Ref Range   Specimen Description BLOOD BLOOD LEFT FOREARM    Special Requests      BOTTLES DRAWN AEROBIC AND ANAEROBIC Blood Culture adequate volume   Culture      NO GROWTH < 24 HOURS Performed at [facility], [address] [place], [place] [address]    Report Status PENDING   Blood culture (routine x 2)     Status: None (Preliminary result)   Collection Time: 11/05/19  3:15 PM   Specimen: Left Antecubital; Blood  Result Value Ref Range   Specimen Description LEFT ANTECUBITAL    Special Requests      BOTTLES DRAWN AEROBIC AND ANAEROBIC Blood Culture adequate volume   Culture      NO GROWTH < 24 HOURS Performed at [facility], [address], [place], [place] [address]    Report Status PENDING   CBG monitoring, ED     Status: Abnormal   Collection Time: 11/05/19 10:20 PM  Result Value Ref Range   Glucose-Capillary 42 (LL) 70 - 99 mg/dL   Comment 1 Document in Chart   CBG monitoring, ED     Status: Abnormal   Collection Time: 11/05/19 10:46 PM  Result Value Ref Range   Glucose-Capillary 158 (H) 70 - 99 mg/dL  Comprehensive metabolic panel     Status: Abnormal   Collection Time: 11/06/19  3:27 AM  Result Value Ref Range   Sodium 132 (L) 135 - 145 mmol/L   Potassium 3.4 (L) 3.5 - 5.1 mmol/L   Chloride 100 98 - 111 mmol/L   CO2  27 22 - 32 mmol/L   Glucose, Bld 44 (LL) 70 - 99 mg/dL    Comment: CRITICAL RESULT CALLED TO, READ BACK BY AND VERIFIED WITH: SAPPELT,J @ 0519 ON 11/06/19 BY JUW    BUN 14 6 - 20 mg/dL   Creatinine, Ser 0.80 0.61 - 1.24 mg/dL   Calcium 8.1 (L) 8.9 - 10.3 mg/dL   Total Protein 6.7 6.5 - 8.1 g/dL   Albumin 2.9 (L) 3.5 - 5.0 g/dL   AST 25 15 - 41 U/L    ALT 23 0 - 44 U/L   Alkaline Phosphatase 39 38 - 126 U/L   Total Bilirubin 1.0 0.3 - 1.2 mg/dL   GFR calc non Af Amer >60 >60 mL/min   GFR calc Af Amer >60 >60 mL/min   Anion gap 5 5 - 15    Comment: Performed at [facility], [address], [place], [place] [address]  CBC     Status: Abnormal   Collection Time: 11/06/19  3:27 AM  Result Value Ref Range   WBC 11.3 (H) 4.0 - 10.5 K/uL   RBC 3.63 (L) 4.22 - 5.81 MIL/uL   Hemoglobin 11.7 (L) 13.0 - 17.0 g/dL   HCT 35.7 (L) 39.0 - 52.0 %   MCV 98.3 80.0 - 100.0 fL   MCH 32.2 26.0 - 34.0 pg   MCHC 32.8 30.0 - 36.0 g/dL   RDW 12.7 11.5 - 15.5 %   Platelets 250 150 - 400 K/uL   nRBC 0.0 0.0 - 0.2 %    Comment: Performed at [facility], [address] [place], [place] [address]  CBG monitoring, ED     Status: Abnormal   Collection Time: 11/06/19  5:53 AM  Result Value Ref Range   Glucose-Capillary 156 (H) 70 - 99 mg/dL  CBG monitoring, ED     Status: Abnormal   Collection Time: 11/06/19  8:05 AM  Result Value Ref Range   Glucose-Capillary 53 (L) 70 - 99 mg/dL    CT ABDOMEN PELVIS W CONTRAST  Result Date: 11/05/2019 CLINICAL DATA:  Left upper quadrant abdominal pain. Nausea and vomiting and diarrhea. EXAM: CT ABDOMEN AND PELVIS WITH CONTRAST TECHNIQUE: Multidetector CT imaging of the abdomen and pelvis was performed using the standard protocol following bolus administration of intravenous contrast. CONTRAST:  151mL OMNIPAQUE IOHEXOL 300 MG/ML  SOLN COMPARISON:  None. FINDINGS: Lower chest: Normal. Hepatobiliary: There is marked distention of the gallbladder with soft tissue stranding around the gallbladder with a small amount of pericholecystic fluid. Numerous tiny stones in the gallbladder. There is a stone in the cystic duct likely obstructing the duct. No dilated bile ducts. There are 2 loculated areas of fluid along the inferior margin of the liver adjacent a to the gallbladder. These collections appear to be subcapsular and are felt  to be related to the adjacent cholecystitis. Pancreas: Unremarkable. No pancreatic ductal dilatation or surrounding inflammatory changes. Spleen: Normal in size without focal abnormality. Adrenals/Urinary Tract: Adrenal glands are unremarkable. Kidneys are normal, without renal calculi, focal lesion, or hydronephrosis. Bladder is unremarkable. Stomach/Bowel: Stomach is within normal limits. Appendix appears normal. No evidence of bowel wall thickening, distention, or inflammatory changes. Vascular/Lymphatic: No significant vascular findings are present. No enlarged abdominal or pelvic lymph nodes. Reproductive: Prostate is unremarkable. Other: No abdominal wall hernia or abnormality. No abdominopelvic ascites. Musculoskeletal: No acute abnormalities. Chronic deformity of the left femoral head and neck with secondary deformity of the acetabulum, likely representing congenital hip dysplasia and secondary  arthritis. IMPRESSION: 1. Acute cholecystitis with a stone in the cystic duct likely obstructing the duct. 2. Two loculated areas of fluid along the inferior margin of the liver adjacent to the gallbladder. These are felt to be related to the adjacent cholecystitis. 3. Chronic deformity of the left femoral head and neck with secondary deformity of the acetabulum, likely representing congenital hip dysplasia and secondary arthritis. Electronically Signed   By: Lorriane Shire M.D.   On: 11/05/2019 11:48    ROS:  Pertinent items are noted in HPI.  Blood pressure 104/75, pulse 88, temperature 98.4 F (36.9 C), temperature source Oral, resp. rate 20, height 5\' 7"  (1.702 m), weight 89.8 kg, SpO2 93 %. Physical Exam: Well-developed and well-nourished white male no acute distress Head is normocephalic, atraumatic Eyes are without scleral icterus Lungs clear to auscultation with equal breath sounds bilaterally Heart examination reveals a regular rate and rhythm without S3, S4, murmurs Abdomen is rotund, soft with  minimal tenderness in the right upper quadrant to palpation.  No hepatosplenomegaly, masses, hernias, or rigidity are noted.  CT scan images personally reviewed Liver enzyme tests within normal limits. Leukocytosis resolving.  Assessment/Plan: Impression: Acute cholecystitis with cholelithiasis, symptomatic improvement Plan: We will discharge patient home today.  We will do outpatient laparoscopic cholecystectomy on 11/08/2019.  The risks and benefits of the procedure including bleeding, infection, hepatobiliary injury, the possibility of an open procedure were fully explained to the patient, who gave informed consent.  Aviva Signs 11/06/2019, 8:39 AM

## 2019-11-06 NOTE — Consult Note (Signed)
Reason for Consult: Cholecystitis, cholelithiasis Referring Physician: Dr. Cecilio Asper Dale Terrell is an 54 y.o. male.  HPI: Patient is a 54 year old white male who presented to the emergency room on 11/05/2019 with worsening right upper quadrant abdominal pain, nausea, and vomiting.  CT scan of the abdomen revealed cholecystitis with cholelithiasis.  He states the episode started approximately 4 days earlier.  He has had intermittent episodes of epigastric pain and right upper quadrant abdominal pain radiating to his right flank.  He has had intermittent nausea.  He denies any fever, chills, jaundice.  In the emergency room, he was started on IV Zosyn and pain medication.  This morning, he states he does feel better.  He denies any nausea or vomiting.  Past Medical History:  Diagnosis Date  . Chronic back pain   . Depression    on zoloft  . Diabetes mellitus without complication ([place])   . Hyperlipidemia   . Hypertension   . Seizure ([place])    as a child, umknown etiology and on no meds ever, only had 1.    Past Surgical History:  Procedure Laterality Date  . COLONOSCOPY WITH PROPOFOL N/A 01/05/2019   Procedure: COLONOSCOPY WITH PROPOFOL;  Surgeon: Danie Binder, MD;  [facility];  Service: Endoscopy;  Laterality: N/A;  12:00pm  . cyst on arm Right   . ESOPHAGOGASTRODUODENOSCOPY (EGD) WITH PROPOFOL  01/05/2019   Procedure: ESOPHAGOGASTRODUODENOSCOPY (EGD) WITH PROPOFOL;  Surgeon: Danie Binder, MD;  [facility];  Service: Endoscopy;;  . POLYPECTOMY  01/05/2019   Procedure: POLYPECTOMY;  Surgeon: Danie Binder, MD;  [facility];  Service: Endoscopy;;    Family History  Problem Relation Age of Onset  . Colon cancer Neg Hx   . Colon polyps Neg Hx     Social History:  reports that he has quit smoking. His smoking use included cigarettes. He has a 0.50 pack-year smoking history. He has never used smokeless tobacco. He reports previous alcohol use. He  reports that he does not use drugs.  Allergies:  Allergies  Allergen Reactions  . Bee Venom Itching and Swelling  . Ciprofloxacin Hives and Itching    Medications: Prior to Admission: (Not in a hospital admission)   Results for orders placed or performed during the hospital encounter of 11/05/19 (from the past 48 hour(s))  Urinalysis, Routine w reflex microscopic     Status: Abnormal   Collection Time: 11/05/19 10:15 AM  Result Value Ref Range   Color, Urine AMBER (A) YELLOW    Comment: BIOCHEMICALS MAY BE AFFECTED BY COLOR   APPearance CLEAR CLEAR   Specific Gravity, Urine 1.021 1.005 - 1.030   pH 5.0 5.0 - 8.0   Glucose, UA NEGATIVE NEGATIVE mg/dL   Hgb urine dipstick SMALL (A) NEGATIVE   Bilirubin Urine NEGATIVE NEGATIVE   Ketones, ur NEGATIVE NEGATIVE mg/dL   Protein, ur 30 (A) NEGATIVE mg/dL   Nitrite NEGATIVE NEGATIVE   Leukocytes,Ua NEGATIVE NEGATIVE   RBC / HPF 0-5 0 - 5 RBC/hpf   WBC, UA 0-5 0 - 5 WBC/hpf   Bacteria, UA NONE SEEN NONE SEEN   Squamous Epithelial / LPF 0-5 0 - 5   Mucus PRESENT     Comment: Performed at [facility], [address], [place], [place] [address]  Lipase, blood     Status: None   Collection Time: 11/05/19 10:16 AM  Result Value Ref Range   Lipase 25 11 - 51 U/L  Comment: Performed at [facility], [address], [place], [place] [address]  Comprehensive metabolic panel     Status: Abnormal   Collection Time: 11/05/19 10:16 AM  Result Value Ref Range   Sodium 132 (L) 135 - 145 mmol/L   Potassium 3.9 3.5 - 5.1 mmol/L   Chloride 98 98 - 111 mmol/L   CO2 25 22 - 32 mmol/L   Glucose, Bld 131 (H) 70 - 99 mg/dL   BUN 27 (H) 6 - 20 mg/dL   Creatinine, Ser 0.98 0.61 - 1.24 mg/dL   Calcium 9.1 8.9 - 10.3 mg/dL   Total Protein 8.0 6.5 - 8.1 g/dL   Albumin 3.5 3.5 - 5.0 g/dL   AST 19 15 - 41 U/L   ALT 19 0 - 44 U/L   Alkaline Phosphatase 43 38 - 126 U/L   Total Bilirubin 1.1 0.3 - 1.2 mg/dL   GFR calc non Af Amer >60 >60 mL/min   GFR  calc Af Amer >60 >60 mL/min   Anion gap 9 5 - 15    Comment: Performed at [facility], [address] [place], [place] [address]  CBC with Differential     Status: Abnormal   Collection Time: 11/05/19 10:16 AM  Result Value Ref Range   WBC 14.3 (H) 4.0 - 10.5 K/uL   RBC 4.39 4.22 - 5.81 MIL/uL   Hemoglobin 14.1 13.0 - 17.0 g/dL   HCT 42.3 39.0 - 52.0 %   MCV 96.4 80.0 - 100.0 fL   MCH 32.1 26.0 - 34.0 pg   MCHC 33.3 30.0 - 36.0 g/dL   RDW 12.7 11.5 - 15.5 %   Platelets 249 150 - 400 K/uL   nRBC 0.0 0.0 - 0.2 %   Neutrophils Relative % 86 %   Neutro Abs 12.3 (H) 1.7 - 7.7 K/uL   Lymphocytes Relative 10 %   Lymphs Abs 1.4 0.7 - 4.0 K/uL   Monocytes Relative 3 %   Monocytes Absolute 0.4 0.1 - 1.0 K/uL   Eosinophils Relative 1 %   Eosinophils Absolute 0.1 0.0 - 0.5 K/uL   Basophils Relative 0 %   Basophils Absolute 0.0 0.0 - 0.1 K/uL   Immature Granulocytes 0 %   Abs Immature Granulocytes 0.05 0.00 - 0.07 K/uL    Comment: Performed at [facility], [address], [place], [place] [address]  POC CBG, ED     Status: None   Collection Time: 11/05/19 11:04 AM  Result Value Ref Range   Glucose-Capillary 81 70 - 99 mg/dL  Respiratory Panel by RT PCR (Flu A&B, Covid) - Nasopharyngeal Swab     Status: None   Collection Time: 11/05/19  1:15 PM   Specimen: Nasopharyngeal Swab  Result Value Ref Range   SARS Coronavirus 2 by RT PCR NEGATIVE NEGATIVE    Comment: (NOTE) SARS-CoV-2 target nucleic acids are NOT DETECTED. The SARS-CoV-2 RNA is generally detectable in upper respiratoy specimens during the acute phase of infection. The lowest concentration of SARS-CoV-2 viral copies this assay can detect is 131 copies/mL. A negative result does not preclude SARS-Cov-2 infection and should not be used as the sole basis for treatment or other patient management decisions. A negative result may occur with  improper specimen collection/handling, submission of specimen other than nasopharyngeal  swab, presence of viral mutation(s) within the areas targeted by this assay, and inadequate number of viral copies (<131 copies/mL). A negative result must be combined with clinical observations, patient history, and epidemiological  information. The expected result is Negative. Fact Sheet for Patients:  PinkCheek.be Fact Sheet for Healthcare Providers:  GravelBags.it This test is not yet ap proved or cleared by the [place] FDA and  has been authorized for detection and/or diagnosis of SARS-CoV-2 by FDA under an Emergency Use Authorization (EUA). This EUA will remain  in effect (meaning this test can be used) for the duration of the COVID-19 declaration under Section 564(b)(1) of the Act, 21 U.S.C. section 360bbb-3(b)(1), unless the authorization is terminated or revoked sooner.    Influenza A by PCR NEGATIVE NEGATIVE   Influenza B by PCR NEGATIVE NEGATIVE    Comment: (NOTE) The Xpert Xpress SARS-CoV-2/FLU/RSV assay is intended as an aid in  the diagnosis of influenza from Nasopharyngeal swab specimens and  should not be used as a sole basis for treatment. Nasal washings and  aspirates are unacceptable for Xpert Xpress SARS-CoV-2/FLU/RSV  testing. Fact Sheet for Patients: PinkCheek.be Fact Sheet for Healthcare Providers: GravelBags.it This test is not yet approved or cleared by the [place] FDA and  has been authorized for detection and/or diagnosis of SARS-CoV-2 by  FDA under an Emergency Use Authorization (EUA). This EUA will remain  in effect (meaning this test can be used) for the duration of the  Covid-19 declaration under Section 564(b)(1) of the Act, 21  U.S.C. section 360bbb-3(b)(1), unless the authorization is  terminated or revoked. Performed at [facility], [address] [place], [place] [address]   Lactate     Status: None   Collection Time:  11/05/19  2:01 PM  Result Value Ref Range   Lactic Acid, Venous 1.0 0.5 - 1.9 mmol/L    Comment: Performed at [facility], [address] [place], [place] [address]  Blood culture (routine x 2)     Status: None (Preliminary result)   Collection Time: 11/05/19  3:07 PM   Specimen: BLOOD  Result Value Ref Range   Specimen Description BLOOD BLOOD LEFT FOREARM    Special Requests      BOTTLES DRAWN AEROBIC AND ANAEROBIC Blood Culture adequate volume   Culture      NO GROWTH < 24 HOURS Performed at [facility], [address], [place], [place] [address]    Report Status PENDING   Blood culture (routine x 2)     Status: None (Preliminary result)   Collection Time: 11/05/19  3:15 PM   Specimen: Left Antecubital; Blood  Result Value Ref Range   Specimen Description LEFT ANTECUBITAL    Special Requests      BOTTLES DRAWN AEROBIC AND ANAEROBIC Blood Culture adequate volume   Culture      NO GROWTH < 24 HOURS Performed at [facility], [address], [place],  [address]    Report Status PENDING   CBG monitoring, ED     Status: Abnormal   Collection Time: 11/05/19 10:20 PM  Result Value Ref Range   Glucose-Capillary 42 (LL) 70 - 99 mg/dL   Comment 1 Document in Chart   CBG monitoring, ED     Status: Abnormal   Collection Time: 11/05/19 10:46 PM  Result Value Ref Range   Glucose-Capillary 158 (H) 70 - 99 mg/dL  Comprehensive metabolic panel     Status: Abnormal   Collection Time: 11/06/19  3:27 AM  Result Value Ref Range   Sodium 132 (L) 135 - 145 mmol/L   Potassium 3.4 (L) 3.5 - 5.1 mmol/L   Chloride 100 98 - 111 mmol/L   CO2  27 22 - 32 mmol/L   Glucose, Bld 44 (LL) 70 - 99 mg/dL    Comment: CRITICAL RESULT CALLED TO, READ BACK BY AND VERIFIED WITH: SAPPELT,J @ 0519 ON 11/06/19 BY JUW    BUN 14 6 - 20 mg/dL   Creatinine, Ser 0.80 0.61 - 1.24 mg/dL   Calcium 8.1 (L) 8.9 - 10.3 mg/dL   Total Protein 6.7 6.5 - 8.1 g/dL   Albumin 2.9 (L) 3.5 - 5.0 g/dL   AST 25 15 - 41 U/L    ALT 23 0 - 44 U/L   Alkaline Phosphatase 39 38 - 126 U/L   Total Bilirubin 1.0 0.3 - 1.2 mg/dL   GFR calc non Af Amer >60 >60 mL/min   GFR calc Af Amer >60 >60 mL/min   Anion gap 5 5 - 15    Comment: Performed at [facility], [address], [place], [place] [address]  CBC     Status: Abnormal   Collection Time: 11/06/19  3:27 AM  Result Value Ref Range   WBC 11.3 (H) 4.0 - 10.5 K/uL   RBC 3.63 (L) 4.22 - 5.81 MIL/uL   Hemoglobin 11.7 (L) 13.0 - 17.0 g/dL   HCT 35.7 (L) 39.0 - 52.0 %   MCV 98.3 80.0 - 100.0 fL   MCH 32.2 26.0 - 34.0 pg   MCHC 32.8 30.0 - 36.0 g/dL   RDW 12.7 11.5 - 15.5 %   Platelets 250 150 - 400 K/uL   nRBC 0.0 0.0 - 0.2 %    Comment: Performed at [facility], [address], [place],  [address]  CBG monitoring, ED     Status: Abnormal   Collection Time: 11/06/19  5:53 AM  Result Value Ref Range   Glucose-Capillary 156 (H) 70 - 99 mg/dL  CBG monitoring, ED     Status: Abnormal   Collection Time: 11/06/19  8:05 AM  Result Value Ref Range   Glucose-Capillary 53 (L) 70 - 99 mg/dL    CT ABDOMEN PELVIS W CONTRAST  Result Date: 11/05/2019 CLINICAL DATA:  Left upper quadrant abdominal pain. Nausea and vomiting and diarrhea. EXAM: CT ABDOMEN AND PELVIS WITH CONTRAST TECHNIQUE: Multidetector CT imaging of the abdomen and pelvis was performed using the standard protocol following bolus administration of intravenous contrast. CONTRAST:  157mL OMNIPAQUE IOHEXOL 300 MG/ML  SOLN COMPARISON:  None. FINDINGS: Lower chest: Normal. Hepatobiliary: There is marked distention of the gallbladder with soft tissue stranding around the gallbladder with a small amount of pericholecystic fluid. Numerous tiny stones in the gallbladder. There is a stone in the cystic duct likely obstructing the duct. No dilated bile ducts. There are 2 loculated areas of fluid along the inferior margin of the liver adjacent a to the gallbladder. These collections appear to be subcapsular and are felt  to be related to the adjacent cholecystitis. Pancreas: Unremarkable. No pancreatic ductal dilatation or surrounding inflammatory changes. Spleen: Normal in size without focal abnormality. Adrenals/Urinary Tract: Adrenal glands are unremarkable. Kidneys are normal, without renal calculi, focal lesion, or hydronephrosis. Bladder is unremarkable. Stomach/Bowel: Stomach is within normal limits. Appendix appears normal. No evidence of bowel wall thickening, distention, or inflammatory changes. Vascular/Lymphatic: No significant vascular findings are present. No enlarged abdominal or pelvic lymph nodes. Reproductive: Prostate is unremarkable. Other: No abdominal wall hernia or abnormality. No abdominopelvic ascites. Musculoskeletal: No acute abnormalities. Chronic deformity of the left femoral head and neck with secondary deformity of the acetabulum, likely representing congenital hip dysplasia and secondary  arthritis. IMPRESSION: 1. Acute cholecystitis with a stone in the cystic duct likely obstructing the duct. 2. Two loculated areas of fluid along the inferior margin of the liver adjacent to the gallbladder. These are felt to be related to the adjacent cholecystitis. 3. Chronic deformity of the left femoral head and neck with secondary deformity of the acetabulum, likely representing congenital hip dysplasia and secondary arthritis. Electronically Signed   By: Lorriane Shire M.D.   On: 11/05/2019 11:48    ROS:  Pertinent items are noted in HPI.  Blood pressure 104/75, pulse 88, temperature 98.4 F (36.9 C), temperature source Oral, resp. rate 20, height 5\' 7"  (1.702 m), weight 89.8 kg, SpO2 93 %. Physical Exam: Well-developed and well-nourished white male no acute distress Head is normocephalic, atraumatic Eyes are without scleral icterus Lungs clear to auscultation with equal breath sounds bilaterally Heart examination reveals a regular rate and rhythm without S3, S4, murmurs Abdomen is rotund, soft with  minimal tenderness in the right upper quadrant to palpation.  No hepatosplenomegaly, masses, hernias, or rigidity are noted.  CT scan images personally reviewed Liver enzyme tests within normal limits. Leukocytosis resolving.  Assessment/Plan: Impression: Acute cholecystitis with cholelithiasis, symptomatic improvement Plan: We will discharge patient home today.  We will do outpatient laparoscopic cholecystectomy on 11/08/2019.  The risks and benefits of the procedure including bleeding, infection, hepatobiliary injury, the possibility of an open procedure were fully explained to the patient, who gave informed consent.  Aviva Signs 11/06/2019, 8:39 AM

## 2019-11-06 NOTE — ED Notes (Signed)
Pt woke up and ambulated without assistance to restroom. Pt now resting comfortably in room.

## 2019-11-06 NOTE — Discharge Summary (Signed)
Physician Discharge Summary  Patient ID: Dale Terrell MRN: [id number] DOB/AGE: 1965-10-29 54 y.o.  Admit date: 11/05/2019 Discharge date: 11/06/2019  Admission Diagnoses: Acute cholecystitis, cholelithiasis  Discharge Diagnoses: Same Active Problems:   Acute cholecystitis   HTN (hypertension)   Type 2 diabetes mellitus with hyperlipidemia ([facility])   GERD (gastroesophageal reflux disease)   Discharged Condition: good  Hospital Course: Patient is a 54 year old white male who presented emergency room with right upper quadrant abdominal pain, nausea, and vomiting.  He was found on CT scan of the abdomen to have acute cholecystitis with cholelithiasis.  Liver enzyme tests were within normal limits.  While he was staying in the emergency room overnight, he was started on Zosyn.  His leukocytosis is resolving.  He is being discharged home on 11/06/2019 in good and improving condition.  He will return on 11/08/2019 for laparoscopic cholecystectomy as an outpatient.  Discharge Exam: Blood pressure 104/75, pulse 88, temperature 98.4 F (36.9 C), temperature source Oral, resp. rate 20, height 5\' 7"  (1.702 m), weight 89.8 kg, SpO2 93 %. General appearance: alert, cooperative and no distress Eyes: No scleral icterus Resp: clear to auscultation bilaterally Cardio: regular rate and rhythm, S1, S2 normal, no murmur, click, rub or gallop GI: Soft with minimal tenderness in the right upper quadrant to palpation.  No rigidity is noted.  Disposition: Discharge disposition: 01-Home or Self Care       Discharge Instructions    Diet - low sodium heart healthy   Complete by: As directed    Increase activity slowly   Complete by: As directed      Allergies as of 11/06/2019      Reactions   Bee Venom Itching, Swelling   Ciprofloxacin Hives, Itching      Medication List    TAKE these medications   aspirin EC 81 MG tablet Take 81 mg by mouth daily.   glimepiride 2 MG tablet Commonly known as:  AMARYL Take 2 mg by mouth daily.   lisinopril 5 MG tablet Commonly known as: ZESTRIL Take 5 mg by mouth daily.   metFORMIN 1000 MG tablet Commonly known as: GLUCOPHAGE Take 1,000 mg by mouth 2 (two) times daily with a meal.   omeprazole 20 MG capsule Commonly known as: PRILOSEC 1 PO 30 MINS PRIOR TO BREAKFAST.   ondansetron 4 MG tablet Commonly known as: Zofran Take 1 tablet (4 mg total) by mouth every 8 (eight) hours as needed for nausea or vomiting.   oxyCODONE-acetaminophen 7.5-325 MG tablet Commonly known as: Percocet Take 1 tablet by mouth every 6 (six) hours as needed.   simvastatin 40 MG tablet Commonly known as: ZOCOR Take 40 mg by mouth at bedtime.   tiZANidine 4 MG tablet Commonly known as: Zanaflex One by mouth every 8 hours as needed for spasm      Follow-up Information    Aviva Signs, MD Follow up.   Specialty: General Surgery Why: Come to Day Surgery at [facility] on Monday 11/08/19 Contact information: [id number] [place] [id number] [phone]           Signed: Aviva Signs 11/06/2019, 8:52 AM

## 2019-11-06 NOTE — ED Notes (Signed)
Pt instructed not to take oral anti diabetics and to no eat after midnight tomorrow night

## 2019-11-08 ENCOUNTER — Inpatient Hospital Stay (HOSPITAL_COMMUNITY)
Admission: AD | Admit: 2019-11-08 | Discharge: 2019-11-10 | DRG: 416 | Disposition: A | Discharge status: Home / Self Care | Payer: Medicare Other | Source: Ambulatory Visit | Attending: General Surgery | Admitting: General Surgery

## 2019-11-08 ENCOUNTER — Encounter (HOSPITAL_COMMUNITY): Payer: Self-pay | Admitting: General Surgery

## 2019-11-08 ENCOUNTER — Ambulatory Visit (HOSPITAL_COMMUNITY): Payer: Medicare Other | Admitting: Anesthesiology

## 2019-11-08 ENCOUNTER — Encounter (HOSPITAL_COMMUNITY)
Admission: AD | Disposition: A | Discharge status: Home / Self Care | Payer: Self-pay | Source: Ambulatory Visit | Attending: General Surgery

## 2019-11-08 ENCOUNTER — Other Ambulatory Visit: Payer: Self-pay

## 2019-11-08 DIAGNOSIS — E11649 Type 2 diabetes mellitus with hypoglycemia without coma: Secondary | ICD-10-CM | POA: Diagnosis present

## 2019-11-08 DIAGNOSIS — K82A1 Gangrene of gallbladder in cholecystitis: Secondary | ICD-10-CM | POA: Diagnosis present

## 2019-11-08 DIAGNOSIS — I10 Essential (primary) hypertension: Secondary | ICD-10-CM | POA: Diagnosis present

## 2019-11-08 DIAGNOSIS — Z7984 Long term (current) use of oral hypoglycemic drugs: Secondary | ICD-10-CM

## 2019-11-08 DIAGNOSIS — Z20822 Contact with and (suspected) exposure to covid-19: Secondary | ICD-10-CM | POA: Diagnosis present

## 2019-11-08 DIAGNOSIS — Z87891 Personal history of nicotine dependence: Secondary | ICD-10-CM

## 2019-11-08 DIAGNOSIS — K8000 Calculus of gallbladder with acute cholecystitis without obstruction: Secondary | ICD-10-CM | POA: Diagnosis not present

## 2019-11-08 DIAGNOSIS — Z79899 Other long term (current) drug therapy: Secondary | ICD-10-CM

## 2019-11-08 DIAGNOSIS — F329 Major depressive disorder, single episode, unspecified: Secondary | ICD-10-CM | POA: Diagnosis present

## 2019-11-08 DIAGNOSIS — Z7982 Long term (current) use of aspirin: Secondary | ICD-10-CM | POA: Diagnosis not present

## 2019-11-08 DIAGNOSIS — E785 Hyperlipidemia, unspecified: Secondary | ICD-10-CM | POA: Diagnosis present

## 2019-11-08 DIAGNOSIS — Z23 Encounter for immunization: Secondary | ICD-10-CM

## 2019-11-08 DIAGNOSIS — K819 Cholecystitis, unspecified: Secondary | ICD-10-CM | POA: Diagnosis not present

## 2019-11-08 DIAGNOSIS — K810 Acute cholecystitis: Secondary | ICD-10-CM | POA: Diagnosis not present

## 2019-11-08 DIAGNOSIS — Z5331 Laparoscopic surgical procedure converted to open procedure: Secondary | ICD-10-CM | POA: Diagnosis not present

## 2019-11-08 DIAGNOSIS — Z881 Allergy status to other antibiotic agents status: Secondary | ICD-10-CM | POA: Diagnosis not present

## 2019-11-08 DIAGNOSIS — K219 Gastro-esophageal reflux disease without esophagitis: Secondary | ICD-10-CM | POA: Diagnosis present

## 2019-11-08 HISTORY — PX: CHOLECYSTECTOMY: SHX55

## 2019-11-08 LAB — GLUCOSE, CAPILLARY
Glucose-Capillary: 108 mg/dL — ABNORMAL HIGH (ref 70–99)
Glucose-Capillary: 116 mg/dL — ABNORMAL HIGH (ref 70–99)
Glucose-Capillary: 104 mg/dL — ABNORMAL HIGH (ref 70–99)
Glucose-Capillary: 66 mg/dL — ABNORMAL LOW (ref 70–99)

## 2019-11-08 LAB — HEMOGLOBIN A1C
Hgb A1c MFr Bld: 5.4 % (ref 4.8–5.6)
Mean Plasma Glucose: 108.28 mg/dL

## 2019-11-08 SURGERY — LAPAROSCOPIC CHOLECYSTECTOMY
Anesthesia: General | Site: Abdomen

## 2019-11-08 MED ORDER — PIPERACILLIN-TAZOBACTAM 3.375 G IVPB
3.3750 g | Freq: Three times a day (TID) | INTRAVENOUS | Status: DC
  Administered 2019-11-08 – 2019-11-10 (×6): 3.375 g via INTRAVENOUS
  Filled 2019-11-08 (×5): qty 50

## 2019-11-08 MED ORDER — ONDANSETRON HCL 4 MG/2ML IJ SOLN: 4.0000 mg | Freq: Four times a day (QID) | INTRAMUSCULAR | Status: DC | PRN

## 2019-11-08 MED ORDER — ONDANSETRON HCL 4 MG/2ML IJ SOLN
INTRAMUSCULAR | Status: DC | PRN
  Administered 2019-11-08: 4 mg via INTRAVENOUS

## 2019-11-08 MED ORDER — POVIDONE-IODINE 10 % EX OINT
TOPICAL_OINTMENT | CUTANEOUS | Status: AC
  Filled 2019-11-08: qty 1

## 2019-11-08 MED ORDER — SODIUM CHLORIDE 0.9 % IV SOLN: INTRAVENOUS | Status: DC

## 2019-11-08 MED ORDER — FENTANYL CITRATE (PF) 100 MCG/2ML IJ SOLN
INTRAMUSCULAR | Status: DC | PRN
  Administered 2019-11-08 (×10): 50 ug via INTRAVENOUS

## 2019-11-08 MED ORDER — SODIUM CHLORIDE 0.9 % IR SOLN
Status: DC | PRN
  Administered 2019-11-08: 3000 mL

## 2019-11-08 MED ORDER — ACETAMINOPHEN 650 MG RE SUPP: 650.0000 mg | Freq: Four times a day (QID) | RECTAL | Status: DC | PRN

## 2019-11-08 MED ORDER — INFLUENZA VAC SPLIT QUAD 0.5 ML IM SUSY
0.5000 mL | PREFILLED_SYRINGE | INTRAMUSCULAR | Status: AC
  Administered 2019-11-09: 11:00:00 0.5 mL via INTRAMUSCULAR
  Filled 2019-11-08: qty 0.5

## 2019-11-08 MED ORDER — SIMETHICONE 80 MG PO CHEW: 40.0000 mg | CHEWABLE_TABLET | Freq: Four times a day (QID) | ORAL | Status: DC | PRN

## 2019-11-08 MED ORDER — LORAZEPAM 2 MG/ML IJ SOLN: 1.0000 mg | INTRAMUSCULAR | Status: DC | PRN

## 2019-11-08 MED ORDER — ONDANSETRON HCL 4 MG/2ML IJ SOLN
INTRAMUSCULAR | Status: AC
  Filled 2019-11-08: qty 2

## 2019-11-08 MED ORDER — FENTANYL CITRATE (PF) 100 MCG/2ML IJ SOLN
INTRAMUSCULAR | Status: AC
  Filled 2019-11-08: qty 2

## 2019-11-08 MED ORDER — PROPOFOL 10 MG/ML IV BOLUS
INTRAVENOUS | Status: DC | PRN
  Administered 2019-11-08: 200 mg via INTRAVENOUS

## 2019-11-08 MED ORDER — DIPHENHYDRAMINE HCL 50 MG/ML IJ SOLN: 25.0000 mg | Freq: Four times a day (QID) | INTRAMUSCULAR | Status: DC | PRN

## 2019-11-08 MED ORDER — BUPIVACAINE LIPOSOME 1.3 % IJ SUSP
INTRAMUSCULAR | Status: AC
  Filled 2019-11-08: qty 20

## 2019-11-08 MED ORDER — LACTATED RINGERS IV SOLN: INTRAVENOUS | Status: DC

## 2019-11-08 MED ORDER — INSULIN ASPART 100 UNIT/ML ~~LOC~~ SOLN
0.0000 [IU] | Freq: Three times a day (TID) | SUBCUTANEOUS | Status: DC
  Administered 2019-11-10: 13:00:00 2 [IU] via SUBCUTANEOUS

## 2019-11-08 MED ORDER — CHLORHEXIDINE GLUCONATE CLOTH 2 % EX PADS: 6.0000 | MEDICATED_PAD | Freq: Once | CUTANEOUS | Status: DC

## 2019-11-08 MED ORDER — MIDAZOLAM HCL 5 MG/5ML IJ SOLN
INTRAMUSCULAR | Status: DC | PRN
  Administered 2019-11-08: 2 mg via INTRAVENOUS

## 2019-11-08 MED ORDER — PROMETHAZINE HCL 25 MG/ML IJ SOLN
6.2500 mg | INTRAMUSCULAR | Status: DC | PRN
  Administered 2019-11-08: 12.5 mg via INTRAVENOUS
  Filled 2019-11-08: qty 1

## 2019-11-08 MED ORDER — FENTANYL CITRATE (PF) 250 MCG/5ML IJ SOLN
INTRAMUSCULAR | Status: AC
  Filled 2019-11-08: qty 5

## 2019-11-08 MED ORDER — HYDROCODONE-ACETAMINOPHEN 7.5-325 MG PO TABS: 1.0000 | ORAL_TABLET | Freq: Once | ORAL | Status: DC | PRN

## 2019-11-08 MED ORDER — ROCURONIUM 10MG/ML (10ML) SYRINGE FOR MEDFUSION PUMP - OPTIME
INTRAVENOUS | Status: DC | PRN
  Administered 2019-11-08 (×2): 10 mg via INTRAVENOUS
  Administered 2019-11-08: 40 mg via INTRAVENOUS

## 2019-11-08 MED ORDER — SUGAMMADEX SODIUM 200 MG/2ML IV SOLN
INTRAVENOUS | Status: DC | PRN
  Administered 2019-11-08: 179.6 mg via INTRAVENOUS

## 2019-11-08 MED ORDER — BUPIVACAINE LIPOSOME 1.3 % IJ SUSP
INTRAMUSCULAR | Status: DC | PRN
  Administered 2019-11-08: 20 mL

## 2019-11-08 MED ORDER — HYDROMORPHONE HCL 1 MG/ML IJ SOLN
0.2500 mg | INTRAMUSCULAR | Status: DC | PRN
  Administered 2019-11-08 (×3): 0.5 mg via INTRAVENOUS
  Filled 2019-11-08 (×3): qty 0.5

## 2019-11-08 MED ORDER — PROPOFOL 10 MG/ML IV BOLUS
INTRAVENOUS | Status: AC
  Filled 2019-11-08: qty 20

## 2019-11-08 MED ORDER — MIDAZOLAM HCL 2 MG/2ML IJ SOLN: 0.5000 mg | Freq: Once | INTRAMUSCULAR | Status: DC | PRN

## 2019-11-08 MED ORDER — SUCCINYLCHOLINE 20MG/ML (10ML) SYRINGE FOR MEDFUSION PUMP - OPTIME
INTRAMUSCULAR | Status: DC | PRN
  Administered 2019-11-08: 140 mg via INTRAVENOUS

## 2019-11-08 MED ORDER — KETOROLAC TROMETHAMINE 30 MG/ML IJ SOLN
INTRAMUSCULAR | Status: AC
  Filled 2019-11-08: qty 1

## 2019-11-08 MED ORDER — PNEUMOCOCCAL VAC POLYVALENT 25 MCG/0.5ML IJ INJ
0.5000 mL | INJECTION | INTRAMUSCULAR | Status: AC
  Administered 2019-11-09: 11:00:00 0.5 mL via INTRAMUSCULAR
  Filled 2019-11-08: qty 0.5

## 2019-11-08 MED ORDER — DIPHENHYDRAMINE HCL 25 MG PO CAPS: 25.0000 mg | ORAL_CAPSULE | Freq: Four times a day (QID) | ORAL | Status: DC | PRN

## 2019-11-08 MED ORDER — SODIUM CHLORIDE 0.9 % IR SOLN
Status: DC | PRN
  Administered 2019-11-08 (×4): 1000 mL

## 2019-11-08 MED ORDER — SODIUM CHLORIDE FLUSH 0.9 % IV SOLN
INTRAVENOUS | Status: AC
  Filled 2019-11-08: qty 10

## 2019-11-08 MED ORDER — LISINOPRIL 5 MG PO TABS
5.0000 mg | ORAL_TABLET | Freq: Every day | ORAL | Status: DC
  Administered 2019-11-08 – 2019-11-10 (×3): 5 mg via ORAL
  Filled 2019-11-08 (×3): qty 1

## 2019-11-08 MED ORDER — LIDOCAINE HCL (CARDIAC) PF 50 MG/5ML IV SOSY
PREFILLED_SYRINGE | INTRAVENOUS | Status: DC | PRN
  Administered 2019-11-08: 60 mg via INTRAVENOUS

## 2019-11-08 MED ORDER — ENOXAPARIN SODIUM 40 MG/0.4ML ~~LOC~~ SOLN
40.0000 mg | SUBCUTANEOUS | Status: DC
  Administered 2019-11-09 – 2019-11-10 (×2): 40 mg via SUBCUTANEOUS
  Filled 2019-11-08 (×2): qty 0.4

## 2019-11-08 MED ORDER — HYDROMORPHONE HCL 1 MG/ML IJ SOLN
1.0000 mg | INTRAMUSCULAR | Status: DC | PRN
  Filled 2019-11-08: qty 1

## 2019-11-08 MED ORDER — KETOROLAC TROMETHAMINE 30 MG/ML IJ SOLN
30.0000 mg | Freq: Four times a day (QID) | INTRAMUSCULAR | Status: AC
  Administered 2019-11-08: 14:00:00 30 mg via INTRAVENOUS

## 2019-11-08 MED ORDER — MIDAZOLAM HCL 2 MG/2ML IJ SOLN
INTRAMUSCULAR | Status: AC
  Filled 2019-11-08: qty 2

## 2019-11-08 MED ORDER — HEMOSTATIC AGENTS (NO CHARGE) OPTIME
TOPICAL | Status: DC | PRN
  Administered 2019-11-08 (×2): 1 via TOPICAL

## 2019-11-08 MED ORDER — ONDANSETRON 4 MG PO TBDP: 4.0000 mg | ORAL_TABLET | Freq: Four times a day (QID) | ORAL | Status: DC | PRN

## 2019-11-08 MED ORDER — KETOROLAC TROMETHAMINE 30 MG/ML IJ SOLN: 30.0000 mg | Freq: Four times a day (QID) | INTRAMUSCULAR | Status: DC | PRN

## 2019-11-08 MED ORDER — OXYCODONE-ACETAMINOPHEN 5-325 MG PO TABS
1.0000 | ORAL_TABLET | ORAL | Status: DC | PRN
  Administered 2019-11-08 – 2019-11-10 (×3): 2 via ORAL
  Filled 2019-11-08 (×3): qty 2

## 2019-11-08 MED ORDER — POVIDONE-IODINE 10 % EX OINT
TOPICAL_OINTMENT | CUTANEOUS | Status: DC | PRN
  Administered 2019-11-08: 1 via TOPICAL

## 2019-11-08 MED ORDER — ACETAMINOPHEN 325 MG PO TABS: 650.0000 mg | ORAL_TABLET | Freq: Four times a day (QID) | ORAL | Status: DC | PRN

## 2019-11-08 MED ORDER — CEFAZOLIN SODIUM-DEXTROSE 2-4 GM/100ML-% IV SOLN
2.0000 g | INTRAVENOUS | Status: AC
  Administered 2019-11-08: 2 g via INTRAVENOUS
  Filled 2019-11-08: qty 100

## 2019-11-08 SURGICAL SUPPLY — 70 items
APPLIER CLIP ROT 10 11.4 M/L (STAPLE) ×2
BAG RETRIEVAL 10 (BASKET) ×1
BLADE HEX COATED 2.75 (ELECTRODE) ×2 IMPLANT
BLADE SURG SZ10 CARB STEEL (BLADE) ×2 IMPLANT
CHLORAPREP W/TINT 26 (MISCELLANEOUS) ×2 IMPLANT
CLIP APPLIE ROT 10 11.4 M/L (STAPLE) ×1 IMPLANT
CLOTH BEACON ORANGE TIMEOUT ST (SAFETY) ×2 IMPLANT
COVER LIGHT HANDLE STERIS (MISCELLANEOUS) ×4 IMPLANT
COVER WAND RF STERILE (DRAPES) ×2 IMPLANT
DERMABOND ADVANCED (GAUZE/BANDAGES/DRESSINGS) ×1
DERMABOND ADVANCED .7 DNX12 (GAUZE/BANDAGES/DRESSINGS) ×1 IMPLANT
DRSG OPSITE POSTOP 4X10 (GAUZE/BANDAGES/DRESSINGS) ×2 IMPLANT
DRSG OPSITE POSTOP 4X8 (GAUZE/BANDAGES/DRESSINGS) ×2 IMPLANT
ELECT BLADE 6 FLAT ULTRCLN (ELECTRODE) ×2 IMPLANT
ELECT REM PT RETURN 9FT ADLT (ELECTROSURGICAL) ×2
ELECTRODE REM PT RTRN 9FT ADLT (ELECTROSURGICAL) ×1 IMPLANT
EVACUATOR DRAINAGE 10X20 100CC (DRAIN) ×1 IMPLANT
EVACUATOR SILICONE 100CC (DRAIN) ×1
GLOVE BIO SURGEON STRL SZ 6.5 (GLOVE) ×4 IMPLANT
GLOVE BIO SURGEON STRL SZ7 (GLOVE) ×2 IMPLANT
GLOVE BIOGEL PI IND STRL 6.5 (GLOVE) ×2 IMPLANT
GLOVE BIOGEL PI IND STRL 7.0 (GLOVE) ×3 IMPLANT
GLOVE BIOGEL PI INDICATOR 6.5 (GLOVE) ×2
GLOVE BIOGEL PI INDICATOR 7.0 (GLOVE) ×3
GLOVE ECLIPSE 6.5 STRL STRAW (GLOVE) ×2 IMPLANT
GLOVE SURG SS PI 7.5 STRL IVOR (GLOVE) ×2 IMPLANT
GOWN STRL REUS W/TWL LRG LVL3 (GOWN DISPOSABLE) ×8 IMPLANT
HANDLE SUCTION POOLE (INSTRUMENTS) ×1 IMPLANT
HEMOSTAT ARISTA ABSORB 3G PWDR (HEMOSTASIS) ×2 IMPLANT
HEMOSTAT SNOW SURGICEL 2X4 (HEMOSTASIS) ×2 IMPLANT
INST SET LAPROSCOPIC AP (KITS) ×2 IMPLANT
IV NS IRRIG 3000ML ARTHROMATIC (IV SOLUTION) ×2 IMPLANT
KIT TURNOVER KIT A (KITS) ×2 IMPLANT
MANIFOLD NEPTUNE II (INSTRUMENTS) ×2 IMPLANT
NEEDLE HYPO 18GX1.5 BLUNT FILL (NEEDLE) ×2 IMPLANT
NEEDLE HYPO 22GX1.5 SAFETY (NEEDLE) ×2 IMPLANT
NEEDLE INSUFFLATION 14GA 120MM (NEEDLE) ×2 IMPLANT
NS IRRIG 1000ML POUR BTL (IV SOLUTION) ×8 IMPLANT
PACK LAP CHOLE LZT030E (CUSTOM PROCEDURE TRAY) ×2 IMPLANT
PAD ARMBOARD 7.5X6 YLW CONV (MISCELLANEOUS) ×2 IMPLANT
PENCIL HANDSWITCHING (ELECTRODE) ×2 IMPLANT
SET BASIN LINEN APH (SET/KITS/TRAYS/PACK) ×2 IMPLANT
SET TUBE IRRIG SUCTION NO TIP (IRRIGATION / IRRIGATOR) ×2 IMPLANT
SET TUBE SMOKE EVAC HIGH FLOW (TUBING) ×2 IMPLANT
SLEEVE ENDOPATH XCEL 5M (ENDOMECHANICALS) ×2 IMPLANT
SPONGE DRAIN TRACH 4X4 STRL 2S (GAUZE/BANDAGES/DRESSINGS) ×2 IMPLANT
SPONGE GAUZE 2X2 8PLY STRL LF (GAUZE/BANDAGES/DRESSINGS) ×2 IMPLANT
SPONGE INTESTINAL PEANUT (DISPOSABLE) ×2 IMPLANT
SPONGE LAP 18X18 RF (DISPOSABLE) ×2 IMPLANT
SPONGE LAP 4X18 RFD (DISPOSABLE) ×2 IMPLANT
STAPLER CUT CVD 40MM GREEN (STAPLE) ×2 IMPLANT
STAPLER VISISTAT (STAPLE) ×2 IMPLANT
SUCTION POOLE HANDLE (INSTRUMENTS) ×2
SUT ETHILON 3 0 FSL (SUTURE) ×2 IMPLANT
SUT MNCRL AB 4-0 PS2 18 (SUTURE) ×4 IMPLANT
SUT VIC AB 0 CT1 27 (SUTURE) ×4
SUT VIC AB 0 CT1 27XBRD ANTBC (SUTURE) ×2 IMPLANT
SUT VIC AB 0 CT1 27XCR 8 STRN (SUTURE) ×2 IMPLANT
SUT VIC AB 2-0 CT2 27 (SUTURE) ×4 IMPLANT
SUT VICRYL 0 UR6 27IN ABS (SUTURE) ×2 IMPLANT
SYR 20ML LL LF (SYRINGE) ×4 IMPLANT
SYS BAG RETRIEVAL 10MM (BASKET) ×1
SYSTEM BAG RETRIEVAL 10MM (BASKET) ×1 IMPLANT
TAPE CLOTH SURG 4X10 WHT LF (GAUZE/BANDAGES/DRESSINGS) ×2 IMPLANT
TROCAR ENDO BLADELESS 11MM (ENDOMECHANICALS) ×2 IMPLANT
TROCAR XCEL NON-BLD 5MMX100MML (ENDOMECHANICALS) ×2 IMPLANT
TROCAR XCEL UNIV SLVE 11M 100M (ENDOMECHANICALS) ×2 IMPLANT
TUBE CONNECTING 12X1/4 (SUCTIONS) ×2 IMPLANT
WARMER LAPAROSCOPE (MISCELLANEOUS) ×2 IMPLANT
YANKAUER SUCT BULB TIP 10FT TU (MISCELLANEOUS) ×2 IMPLANT

## 2019-11-08 NOTE — Anesthesia Postprocedure Evaluation (Signed)
Anesthesia Post Note  Patient: Dale Terrell  Procedure(s) Performed: ATTEMPTED LAPAROSCOPIC CHOLECYSTECTOMY; OPEN PARTIAL CHOLECYSTECTOMY (N/A Abdomen)  Patient location during evaluation: PACU Anesthesia Type: General Level of consciousness: awake and alert and oriented Pain management: pain level controlled Vital Signs Assessment: post-procedure vital signs reviewed and stable Respiratory status: spontaneous breathing Cardiovascular status: stable Postop Assessment: no apparent nausea or vomiting Anesthetic complications: no     Last Vitals:  Vitals:   11/08/19 1315 11/08/19 1330  BP: 127/71 116/76  Pulse: 85 92  Resp: 16 16  Temp:    SpO2: 100% 100%    Last Pain:  Vitals:   11/08/19 1330  TempSrc:   PainSc: 6                  Maycie Luera A

## 2019-11-08 NOTE — Transfer of Care (Signed)
Immediate Anesthesia Transfer of Care Note  Patient: Dale Terrell  Procedure(s) Performed: ATTEMPTED LAPAROSCOPIC CHOLECYSTECTOMY; OPEN PARTIAL CHOLECYSTECTOMY (N/A Abdomen)  Patient Location: PACU  Anesthesia Type:General  Level of Consciousness: drowsy and patient cooperative  Airway & Oxygen Therapy: Patient Spontanous Breathing and Patient connected to face mask oxygen  Post-op Assessment: Report given to RN and Post -op Vital signs reviewed and stable  Post vital signs: Reviewed and stable  Last Vitals:  Vitals Value Taken Time  BP 113/70 11/08/19 1308  Temp    Pulse 91 11/08/19 1312  Resp 19 11/08/19 1312  SpO2 100 % 11/08/19 1312  Vitals shown include unvalidated device data.  Last Pain:  Vitals:   11/08/19 0921  TempSrc: Oral  PainSc: 3          Complications: No apparent anesthesia complications

## 2019-11-08 NOTE — Interval H&P Note (Signed)
History and Physical Interval Note:  11/08/2019 10:35 AM  Dale Terrell  has presented today for surgery, with the diagnosis of acute cholecystitis, cholelithiasis.  The various methods of treatment have been discussed with the patient and family. After consideration of risks, benefits and other options for treatment, the patient has consented to  Procedure(s): LAPAROSCOPIC CHOLECYSTECTOMY (N/A) as a surgical intervention.  The patient's history has been reviewed, patient examined, no change in status, stable for surgery.  I have reviewed the patient's chart and labs.  Questions were answered to the patient's satisfaction.     Aviva Signs

## 2019-11-08 NOTE — Anesthesia Preprocedure Evaluation (Signed)
Anesthesia Evaluation  Patient identified by MRN, date of birth, ID band Patient awake    Reviewed: Allergy & Precautions, NPO status , Patient's Chart, lab work & pertinent test results  Airway Mallampati: III  TM Distance: >3 FB Neck ROM: Full    Dental no notable dental hx. (+) Teeth Intact   Pulmonary shortness of breath and with exertion, former smoker,    Pulmonary exam normal breath sounds clear to auscultation       Cardiovascular Exercise Tolerance: Good hypertension, Pt. on medications + DOE  Normal cardiovascular examI Rhythm:Regular Rate:Normal  Denies Known cardiac issues /MI/CP States does get DOE -ex smoker    Neuro/Psych Depression Denies Sz - states has had low sugar episodes- states no knowm Sz or Sz meds  negative neurological ROS  negative psych ROS   GI/Hepatic Neg liver ROS, GERD  Medicated and Controlled,  Endo/Other  negative endocrine ROSdiabetes, Type 2, Oral Hypoglycemic Agents  Renal/GU negative Renal ROS  negative genitourinary   Musculoskeletal negative musculoskeletal ROS (+)   Abdominal   Peds negative pediatric ROS (+)  Hematology negative hematology ROS (+)   Anesthesia Other Findings   Reproductive/Obstetrics negative OB ROS                             Anesthesia Physical Anesthesia Plan  ASA: II  Anesthesia Plan: General   Post-op Pain Management:    Induction: Intravenous  PONV Risk Score and Plan: 2 and Ondansetron, Dexamethasone and Treatment may vary due to age or medical condition  Airway Management Planned: Oral ETT  Additional Equipment:   Intra-op Plan:   Post-operative Plan: Extubation in OR  Informed Consent: I have reviewed the patients History and Physical, chart, labs and discussed the procedure including the risks, benefits and alternatives for the proposed anesthesia with the patient or authorized representative who has  indicated his/her understanding and acceptance.     Dental advisory given  Plan Discussed with: CRNA  Anesthesia Plan Comments: (Plan Full PPE use Plan GETA D/W PT -WTP with same after Q&A)        Anesthesia Quick Evaluation

## 2019-11-08 NOTE — Op Note (Signed)
Patient:  Dale Terrell  DOB:  1966/04/12  MRN:  [id number]   Preop Diagnosis: Acute cholecystitis, cholelithiasis  Postop Diagnosis: Gangrenous cholecystitis  Procedure: Open cholecystectomy  Surgeon: Aviva Signs, MD  Assistant: Curlene Labrum, MD  Anes: General endotracheal  Indications: Patient is a 54 year old white male who presents with acute cholecystitis secondary to cholelithiasis.  The risks and benefits of the procedure including bleeding, infection, hepatobiliary injury, and the possibility of an open procedure were fully explained to the patient, who gave informed consent.  Procedure note: The patient was placed in supine position.  After induction of general endotracheal anesthesia, the abdomen was prepped and draped using the usual sterile technique with ChloraPrep.  Surgical site confirmation was performed.  A supraumbilical incision was made down to the fascia.  A Veress needle was introduced into the abdominal cavity and confirmation of placement was done using the saline drop test.  The abdomen was then insufflated to 15 mmHg pressure.  An 11 mm trocar was introduced into the abdominal cavity under direct visualization without difficulty.  The patient was placed in reverse Trendelenburg position and an additional level meter trocar was placed in the epigastric region and 5 mm trochars were placed the right upper quadrant regions.  Liver was inspected and noted to be somewhat enlarged.  The gallbladder was noted to be gangrenous with a very thin wall.  This appeared to extend from the fundus down to the infundibulum.  I attempted to do a dome down approach, but the gallbladder wall was necrotic and friable.  The porta hepatis was all inflamed and it was difficult to identify structures.  At this point, we elected to proceed with an open procedure.  The patient had multiple small stones present.  These were evacuated from the lumen of the gallbladder.  Full-thickness gangrene  of the gallbladder wall was noted.  As the cystic duct could not be fully identified, it was elected to proceed with a partial cholecystectomy.  I did remove approximately 30 to 40% of the gallbladder.  A TA 60 stapler was placed across the midportion of the gallbladder once it was freed away from the liver in a dome down approach.  As it was difficult to fully certain that the staple line would hold, I did oversew this and a small opening in the lumen of the gallbladder with a 2-0 Vicryl running suture.  The liver bed was inspected and a bleeding was controlled using Bovie electrocautery.  The right upper quadrant was copiously irrigated with normal saline.  Arista and Surgicel snow were placed into the gallbladder fossa and over the remaining gallbladder.  A #10 flat Jackson-Pratt drain was placed into the subhepatic space and brought through separate stab wound inferior to the incision line.  It was secured at the skin level using a 3-0 nylon suture.  In reinspecting the right upper quadrant, there was no bile leakage present.  The posterior rectus sheath was reapproximated using a running 0 Vicryl suture.  The anterior rectus sheath was reapproximated using 0 Vicryl interrupted sutures.  The subcutaneous layer was irrigated with normal saline.  All incisions were instilled with Exparel.  All incisions were closed using staples.  Betadine ointment and dry sterile dressings were applied.  All tape and needle counts were correct at the end of the procedure.  The patient was extubated in the operating room and transferred to PACU in stable condition.  Complications: None  EBL: 150 cc  Specimen: Gallbladder  Drains: Jackson-Pratt  drain to subhepatic space

## 2019-11-08 NOTE — Anesthesia Procedure Notes (Signed)
Procedure Name: Intubation Date/Time: 11/08/2019 10:59 AM Performed by: Ollen Bowl, CRNA Pre-anesthesia Checklist: Patient identified, Patient being monitored, Timeout performed, Emergency Drugs available and Suction available Patient Re-evaluated:Patient Re-evaluated prior to induction Oxygen Delivery Method: Circle system utilized Preoxygenation: Pre-oxygenation with 100% oxygen Induction Type: IV induction Ventilation: Mask ventilation without difficulty Laryngoscope Size: Mac and 4 Grade View: Grade I Tube type: Oral Tube size: 7.0 mm Number of attempts: 1 Airway Equipment and Method: Stylet Placement Confirmation: ETT inserted through vocal cords under direct vision,  positive ETCO2 and breath sounds checked- equal and bilateral Secured at: 24 cm Tube secured with: Tape Dental Injury: Teeth and Oropharynx as per pre-operative assessment

## 2019-11-09 LAB — CBC
HCT: 32.5 % — ABNORMAL LOW (ref 39.0–52.0)
Hemoglobin: 10.5 g/dL — ABNORMAL LOW (ref 13.0–17.0)
MCH: 32.2 pg (ref 26.0–34.0)
MCHC: 32.3 g/dL (ref 30.0–36.0)
MCV: 99.7 fL (ref 80.0–100.0)
Platelets: 246 10*3/uL (ref 150–400)
RBC: 3.26 MIL/uL — ABNORMAL LOW (ref 4.22–5.81)
RDW: 12.8 % (ref 11.5–15.5)
WBC: 8.1 10*3/uL (ref 4.0–10.5)
nRBC: 0 % (ref 0.0–0.2)

## 2019-11-09 LAB — BASIC METABOLIC PANEL
Anion gap: 7 (ref 5–15)
BUN: 8 mg/dL (ref 6–20)
CO2: 28 mmol/L (ref 22–32)
Calcium: 7.6 mg/dL — ABNORMAL LOW (ref 8.9–10.3)
Chloride: 100 mmol/L (ref 98–111)
Creatinine, Ser: 0.86 mg/dL (ref 0.61–1.24)
GFR calc Af Amer: >60 mL/min (ref >60)
GFR calc non Af Amer: >60 mL/min (ref >60)
Glucose, Bld: 91 mg/dL (ref 70–99)
Potassium: 4.1 mmol/L (ref 3.5–5.1)
Sodium: 135 mmol/L (ref 135–145)

## 2019-11-09 LAB — POCT I-STAT, CHEM 8
BUN: 10 mg/dL (ref 6–20)
Calcium, Ion: 1.1 mmol/L — ABNORMAL LOW (ref 1.15–1.40)
Chloride: 98 mmol/L (ref 98–111)
Creatinine, Ser: 0.6 mg/dL — ABNORMAL LOW (ref 0.61–1.24)
Glucose, Bld: 111 mg/dL — ABNORMAL HIGH (ref 70–99)
HCT: 37 % — ABNORMAL LOW (ref 39.0–52.0)
Hemoglobin: 12.6 g/dL — ABNORMAL LOW (ref 13.0–17.0)
Potassium: 3.9 mmol/L (ref 3.5–5.1)
Sodium: 135 mmol/L (ref 135–145)
TCO2: 25 mmol/L (ref 22–32)

## 2019-11-09 LAB — HEPATIC FUNCTION PANEL
ALT: 42 U/L (ref 0–44)
AST: 36 U/L (ref 15–41)
Albumin: 2.5 g/dL — ABNORMAL LOW (ref 3.5–5.0)
Alkaline Phosphatase: 33 U/L — ABNORMAL LOW (ref 38–126)
Bilirubin, Direct: 0.2 mg/dL (ref 0.0–0.2)
Indirect Bilirubin: 0.5 mg/dL (ref 0.3–0.9)
Total Bilirubin: 0.7 mg/dL (ref 0.3–1.2)
Total Protein: 5.8 g/dL — ABNORMAL LOW (ref 6.5–8.1)

## 2019-11-09 LAB — GLUCOSE, CAPILLARY
Glucose-Capillary: 72 mg/dL (ref 70–99)
Glucose-Capillary: 108 mg/dL — ABNORMAL HIGH (ref 70–99)
Glucose-Capillary: 82 mg/dL (ref 70–99)
Glucose-Capillary: 125 mg/dL — ABNORMAL HIGH (ref 70–99)

## 2019-11-09 NOTE — Progress Notes (Signed)
1 Day Post-Op  Subjective: Patient has incisional pain, but is well controlled.  He had a small watery stool this morning.  He denies any nausea or vomiting.  Objective: Vital signs in last 24 hours: Temp:  [97.8 F (36.6 C)-98.9 F (37.2 C)] 98.1 F (36.7 C) (01/12 0703) Pulse Rate:  [67-100] 88 (01/12 0703) Resp:  [11-22] 19 (01/12 0703) BP: (101-127)/(61-86) 101/64 (01/12 0703) SpO2:  [96 %-100 %] 100 % (01/12 0703) Last BM Date: 11/08/19  Intake/Output from previous day: 01/11 0701 - 01/12 0700 In: 2052.3 [I.V.:1979.7; IV Piggyback:72.7] Out: 555 [Urine:250; Drains:155; Blood:150] Intake/Output this shift: No intake/output data recorded.  General appearance: alert, cooperative and no distress Eyes: No scleral icterus Resp: clear to auscultation bilaterally Cardio: regular rate and rhythm, S1, S2 normal, no murmur, click, rub or gallop GI: Soft, dressings dry and intact.  JP drainage serosanguineous in nature.  No bile present.  Lab Results:  Recent Labs    11/08/19 0938 11/09/19 0555  WBC  --  8.1  HGB 12.6* 10.5*  HCT 37.0* 32.5*  PLT  --  246   BMET Recent Labs    11/08/19 0938 11/09/19 0555  NA 135 135  K 3.9 4.1  CL 98 100  CO2  --  28  GLUCOSE 111* 91  BUN 10 8  CREATININE 0.60* 0.86  CALCIUM  --  7.6*   PT/INR No results for input(s): LABPROT, INR in the last 72 hours.  Studies/Results: No results found.  Anti-infectives: Anti-infectives (From admission, onward)   Start     Dose/Rate Route Frequency Ordered Stop   11/08/19 1545  piperacillin-tazobactam (ZOSYN) IVPB 3.375 g     3.375 g 12.5 mL/hr over 240 Minutes Intravenous Every 8 hours 11/08/19 1534     11/08/19 0845  ceFAZolin (ANCEF) IVPB 2g/100 mL premix     2 g 200 mL/hr over 30 Minutes Intravenous On call to O.R. 11/08/19 0840 11/08/19 1102      Assessment/Plan: s/p Procedure(s): ATTEMPTED LAPAROSCOPIC CHOLECYSTECTOMY; OPEN PARTIAL CHOLECYSTECTOMY Impression: Postoperative day  1.  Patient's liver enzyme tests all appear to be within normal limits.  No evidence of bile leakage.  White blood cell count stable.  Looks surprisingly good after undergoing partial cholecystectomy for gangrenous cholecystitis.  Continue IV antibiotic.  Will advance diet.  LOS: 1 day    Aviva Signs 11/09/2019

## 2019-11-10 DIAGNOSIS — Z23 Encounter for immunization: Secondary | ICD-10-CM | POA: Diagnosis not present

## 2019-11-10 LAB — BASIC METABOLIC PANEL
Anion gap: 8 (ref 5–15)
BUN: 9 mg/dL (ref 6–20)
CO2: 29 mmol/L (ref 22–32)
Calcium: 8 mg/dL — ABNORMAL LOW (ref 8.9–10.3)
Chloride: 101 mmol/L (ref 98–111)
Creatinine, Ser: 0.89 mg/dL (ref 0.61–1.24)
GFR calc Af Amer: >60 mL/min (ref >60)
GFR calc non Af Amer: >60 mL/min (ref >60)
Glucose, Bld: 101 mg/dL — ABNORMAL HIGH (ref 70–99)
Potassium: 3.5 mmol/L (ref 3.5–5.1)
Sodium: 138 mmol/L (ref 135–145)

## 2019-11-10 LAB — CULTURE, BLOOD (ROUTINE X 2)
Culture: NO GROWTH
Culture: NO GROWTH
Special Requests: ADEQUATE
Special Requests: ADEQUATE

## 2019-11-10 LAB — CBC
HCT: 31.3 % — ABNORMAL LOW (ref 39.0–52.0)
Hemoglobin: 9.8 g/dL — ABNORMAL LOW (ref 13.0–17.0)
MCH: 31.8 pg (ref 26.0–34.0)
MCHC: 31.3 g/dL (ref 30.0–36.0)
MCV: 101.6 fL — ABNORMAL HIGH (ref 80.0–100.0)
Platelets: 264 10*3/uL (ref 150–400)
RBC: 3.08 MIL/uL — ABNORMAL LOW (ref 4.22–5.81)
RDW: 12.9 % (ref 11.5–15.5)
WBC: 7.1 10*3/uL (ref 4.0–10.5)
nRBC: 0 % (ref 0.0–0.2)

## 2019-11-10 LAB — GLUCOSE, CAPILLARY
Glucose-Capillary: 74 mg/dL (ref 70–99)
Glucose-Capillary: 148 mg/dL — ABNORMAL HIGH (ref 70–99)

## 2019-11-10 LAB — HEPATIC FUNCTION PANEL
ALT: 28 U/L (ref 0–44)
AST: 19 U/L (ref 15–41)
Albumin: 2.4 g/dL — ABNORMAL LOW (ref 3.5–5.0)
Alkaline Phosphatase: 32 U/L — ABNORMAL LOW (ref 38–126)
Bilirubin, Direct: 0.1 mg/dL (ref 0.0–0.2)
Indirect Bilirubin: 0.4 mg/dL (ref 0.3–0.9)
Total Bilirubin: 0.5 mg/dL (ref 0.3–1.2)
Total Protein: 5.9 g/dL — ABNORMAL LOW (ref 6.5–8.1)

## 2019-11-10 LAB — SURGICAL PATHOLOGY

## 2019-11-10 LAB — PHOSPHORUS: Phosphorus: 2.7 mg/dL (ref 2.5–4.6)

## 2019-11-10 LAB — MAGNESIUM: Magnesium: 2.1 mg/dL (ref 1.7–2.4)

## 2019-11-10 MED ORDER — AMOXICILLIN-POT CLAVULANATE 875-125 MG PO TABS: 1.0000 | ORAL_TABLET | Freq: Two times a day (BID) | ORAL | 0 refills | Status: DC

## 2019-11-10 MED ORDER — OXYCODONE-ACETAMINOPHEN 7.5-325 MG PO TABS: 1.0000 | ORAL_TABLET | Freq: Four times a day (QID) | ORAL | 0 refills | Status: DC | PRN

## 2019-11-10 NOTE — Progress Notes (Signed)
Discharge instruction reviewed with patient.  Maintenance of JP drain reviewed with patient.  Patient verbalized understanding and returned demonstration of JP drain care.  Patient discharged home with family in stable condition.

## 2019-11-10 NOTE — Discharge Summary (Signed)
Physician Discharge Summary  Patient ID: Dale Terrell MRN: [id number] DOB/AGE: 1966-09-05 54 y.o.  Admit date: 11/08/2019 Discharge date: 11/10/2019  Admission Diagnoses: Acute cholecystitis, cholelithiasis  Discharge Diagnoses: Acute gangrenous cholecystitis Active Problems:   Gangrenous cholecystitis   Acute gangrenous cholecystitis Non-insulin-dependent diabetes mellitus  Discharged Condition: good  Hospital Course: Patient is a 54 year old white male who presented to [facility] on 11/08/2019 for an outpatient laparoscopic cholecystectomy.  He was found at the time of surgery to have a gangrenous gallbladder, thus he was converted to an open procedure and had approximately 30 to 40% of his gallbladder removed due to the gangrenous nature of the gallbladder.  A drain was placed.  His postoperative course was for the most part unremarkable.  His diet was advanced out difficulty.  His liver enzyme tests have all remained within normal limits.  He has had no leukocytosis.  His JP drainage has been serosanguineous in nature.  He has been discharged home on 11/10/2019 in good and improving condition.  Treatments: surgery: Open cholecystectomy on 11/08/2019  Discharge Exam: Blood pressure 101/74, pulse 77, temperature 97.8 F (36.6 C), temperature source Oral, resp. rate 18, SpO2 100 %. General appearance: alert, cooperative and no distress Resp: clear to auscultation bilaterally Cardio: regular rate and rhythm, S1, S2 normal, no murmur, click, rub or gallop GI: Soft, dressings dry and intact.  JP drainage serosanguineous in nature.  No bile present.  Disposition: Discharge disposition: 01-Home or Self Care       Discharge Instructions    Diet - low sodium heart healthy   Complete by: As directed    Increase activity slowly   Complete by: As directed      Allergies as of 11/10/2019      Reactions   Bee Venom Itching, Swelling   Ciprofloxacin Hives, Itching       Medication List    TAKE these medications   amoxicillin-clavulanate 875-125 MG tablet Commonly known as: Augmentin Take 1 tablet by mouth 2 (two) times daily.   aspirin EC 81 MG tablet Take 81 mg by mouth daily.   glimepiride 2 MG tablet Commonly known as: AMARYL Take 2 mg by mouth daily.   lisinopril 5 MG tablet Commonly known as: ZESTRIL Take 5 mg by mouth daily.   metFORMIN 1000 MG tablet Commonly known as: GLUCOPHAGE Take 1,000 mg by mouth 2 (two) times daily with a meal.   omeprazole 20 MG capsule Commonly known as: PRILOSEC 1 PO 30 MINS PRIOR TO BREAKFAST.   ondansetron 4 MG tablet Commonly known as: Zofran Take 1 tablet (4 mg total) by mouth every 8 (eight) hours as needed for nausea or vomiting.   oxyCODONE-acetaminophen 7.5-325 MG tablet Commonly known as: Percocet Take 1 tablet by mouth every 6 (six) hours as needed.   simvastatin 40 MG tablet Commonly known as: ZOCOR Take 40 mg by mouth at bedtime.      Follow-up Information    Aviva Signs, MD Follow up on 11/16/2019.   Specialty: General Surgery Contact information: [id number] [id number] [phone]           Signed: Aviva Signs 11/10/2019, 10:43 AM

## 2019-11-10 NOTE — Discharge Instructions (Signed)
Surgical Drain Home Care Surgical drains are used to remove extra fluid that normally builds up in a surgical wound after surgery. A surgical drain helps to heal a surgical wound. Different kinds of surgical drains include:  Active drains. These drains use suction to pull drainage away from the surgical wound. Drainage flows through a tube to a container outside of the body. With these drains, you need to keep the bulb or the drainage container flat (compressed) at all times, except while you empty it. Flattening the bulb or container creates suction.  Passive drains. These drains allow fluid to drain naturally, by gravity. Drainage flows through a tube to a bandage (dressing) or a container outside of the body. Passive drains do not need to be emptied. A drain is placed during surgery. Right after surgery, drainage is usually bright red and a little thicker than water. The drainage may gradually turn yellow or pink and become thinner. It is likely that your health care provider will remove the drain when the drainage stops or when the amount decreases to 1-2 Tbsp (15-30 mL) during a 24-hour period. Supplies needed:  Tape.  Germ-free cleaning solution (sterile saline).  Cotton swabs.  Split gauze drain sponge: 4 x 4 inches (10 x 10 cm).  Gauze square: 4 x 4 inches (10 x 10 cm). How to care for your surgical drain Care for your drain as told by your health care provider. This is important to help prevent infection. If your drain is placed at your back, or any other hard-to-reach area, ask another person to assist you in performing the following tasks: General care  Keep the skin around the drain dry and covered with a dressing at all times.  Check your drain area every day for signs of infection. Check for: ? Redness, swelling, or pain. ? Pus or a bad smell. ? Cloudy drainage. ? Tenderness or pressure at the drain exit site. Changing the dressing Follow instructions from your health care  provider about how to change your dressing. Change your dressing at least once a day. Change it more often if needed to keep the dressing dry. Make sure you: 1. Gather your supplies. 2. Wash your hands with soap and water before you change your dressing. If soap and water are not available, use hand sanitizer. 3. Remove the old dressing. Avoid using scissors to do that. 4. Wash your hands with soap and water again after removing the old dressing. 5. Use sterile saline to clean your skin around the drain. You may need to use a cotton swab to clean the skin. 6. Place the tube through the slit in a drain sponge. Place the drain sponge so that it covers your wound. 7. Place the gauze square or another drain sponge on top of the drain sponge that is on the wound. Make sure the tube is between those layers. 8. Tape the dressing to your skin. 9. Tape the drainage tube to your skin 1-2 inches (2.5-5 cm) below the place where the tube enters your body. Taping keeps the tube from pulling on any stitches (sutures) that you have. 10. Wash your hands with soap and water. 11. Write down the color of your drainage and how often you change your dressing. How to empty your active drain  1. Make sure that you have a measuring cup that you can empty your drainage into. 2. Wash your hands with soap and water. If soap and water are not available, use hand sanitizer. 3.   Loosen any pins or clips that hold the tube in place. 4. If your health care provider tells you to strip the tube to prevent clots and tube blockages: ? Hold the tube at the skin with one hand. Use your other hand to pinch the tubing with your thumb and first finger. ? Gently move your fingers down the tube while squeezing very lightly. This clears any drainage, clots, or tissue from the tube. ? You may need to do this several times each day to keep the tube clear. Do not pull on the tube. 5. Open the bulb cap or the drain plug. Do not touch the  inside of the cap or the bottom of the plug. 6. Turn the device upside down and gently squeeze. 7. Empty all of the drainage into the measuring cup. 8. Compress the bulb or the container and replace the cap or the plug. To compress the bulb or the container, squeeze it firmly in the middle while you close the cap or plug the container. 9. Write down the amount of drainage that you have in each 24-hour period. If you have less than 2 Tbsp (30 mL) of drainage during 24 hours, contact your health care provider. 10. Flush the drainage down the toilet. 11. Wash your hands with soap and water. Contact a health care provider if:  You have redness, swelling, or pain around your drain area.  You have pus or a bad smell coming from your drain area.  You have a fever or chills.  The skin around your drain is warm to the touch.  The amount of drainage that you have is increasing instead of decreasing.  You have drainage that is cloudy.  There is a sudden stop or a sudden decrease in the amount of drainage that you have.  Your drain tube falls out.  Your active drain does not stay compressed after you empty it. Summary  Surgical drains are used to remove extra fluid that normally builds up in a surgical wound after surgery.  Different kinds of surgical drains include active drains and passive drains. Active drains use suction to pull drainage away from the surgical wound, and passive drains allow fluid to drain naturally.  It is important to care for your drain to prevent infection. If your drain is placed at your back, or any other hard-to-reach area, ask another person to assist you.  Contact your health care provider if you have redness, swelling, or pain around your drain area. This information is not intended to replace advice given to you by your health care provider. Make sure you discuss any questions you have with your health care provider. Document Revised: 11/18/2018 Document  Reviewed: 11/18/2018 Elsevier Patient Education  2020 [employer]  

## 2019-11-16 ENCOUNTER — Encounter: Payer: Self-pay | Admitting: General Surgery

## 2019-11-16 ENCOUNTER — Ambulatory Visit (INDEPENDENT_AMBULATORY_CARE_PROVIDER_SITE_OTHER): Payer: Self-pay | Admitting: General Surgery

## 2019-11-16 ENCOUNTER — Other Ambulatory Visit: Payer: Self-pay

## 2019-11-16 VITALS — BP 106/63 | HR 80 | Temp 98.7°F | Resp 18 | Ht 67.0 in | Wt 192.0 lb

## 2019-11-16 DIAGNOSIS — Z09 Encounter for follow-up examination after completed treatment for conditions other than malignant neoplasm: Secondary | ICD-10-CM

## 2019-11-16 MED ORDER — OXYCODONE-ACETAMINOPHEN 7.5-325 MG PO TABS: 1.0000 | ORAL_TABLET | Freq: Four times a day (QID) | ORAL | 0 refills | Status: AC | PRN

## 2019-11-17 NOTE — Progress Notes (Signed)
Subjective:     Dale Terrell  Status post partial open cholecystectomy for gangrenous cholecystitis.  Patient states he is doing well.  He denies any fever or chills.  No jaundice has been noted.  JP drainage has been serosanguineous in nature.  No green drainage noted.  Patient having moderate incisional pain. Objective:    BP 106/63 (BP Location: Left Arm, Patient Position: Sitting, Cuff Size: Normal)   Pulse 80   Temp 98.7 F (37.1 C)   Resp 18   Ht 5\' 7"  (1.702 m)   Wt 192 lb (87.1 kg)   SpO2 97%   BMI 30.07 kg/m   General:  alert, cooperative and no distress     Abdomen is soft, incisions healing well.  JP drainage minimal with serosanguineous fluid present.  No bile is present.  Staples removed, Steri-Strips applied.  Assessment:    Doing well postoperatively.    Plan:   Percocet has been reordered.  Continue JP drain management.  Follow-up here in 1 week.  I did discuss with him the possibility of being referred to hepatobiliary surgeon for further management and treatment.

## 2019-11-23 ENCOUNTER — Other Ambulatory Visit: Payer: Self-pay

## 2019-11-23 ENCOUNTER — Ambulatory Visit (INDEPENDENT_AMBULATORY_CARE_PROVIDER_SITE_OTHER): Payer: Self-pay | Admitting: General Surgery

## 2019-11-23 ENCOUNTER — Encounter: Payer: Self-pay | Admitting: General Surgery

## 2019-11-23 VITALS — BP 102/66 | HR 81 | Temp 98.7°F | Resp 16 | Ht 67.0 in | Wt 187.0 lb

## 2019-11-23 DIAGNOSIS — Z09 Encounter for follow-up examination after completed treatment for conditions other than malignant neoplasm: Secondary | ICD-10-CM

## 2019-11-23 NOTE — Progress Notes (Signed)
Subjective:     Dale Terrell  Here for postoperative visit.  Patient has had no significant drainage from his JP drain.  It has never been bilious in nature.  Denies any fever or chills. Objective:    BP 102/66 (BP Location: Left Arm, Patient Position: Sitting, Cuff Size: Normal)   Pulse 81   Temp 98.7 F (37.1 C) (Oral)   Resp 16   Ht 5\' 7"  (1.702 m)   Wt 187 lb (84.8 kg)   SpO2 98%   BMI 29.29 kg/m   General:  alert, cooperative and no distress  Abdomen soft, incisions healing well.  JP drain removed.     Assessment:    Doing well postoperatively.    Plan:   We will follow up in 2 weeks.  Given that he is doing so well, may delay any further completion cholecystectomy at this time.  Patient understands this and agrees.

## 2019-12-07 ENCOUNTER — Ambulatory Visit (INDEPENDENT_AMBULATORY_CARE_PROVIDER_SITE_OTHER): Payer: Self-pay | Admitting: General Surgery

## 2019-12-07 ENCOUNTER — Encounter: Payer: Self-pay | Admitting: General Surgery

## 2019-12-07 ENCOUNTER — Other Ambulatory Visit: Payer: Self-pay

## 2019-12-07 VITALS — BP 116/70 | HR 91 | Temp 98.4°F | Resp 16 | Ht 67.0 in | Wt 185.0 lb

## 2019-12-07 DIAGNOSIS — Z09 Encounter for follow-up examination after completed treatment for conditions other than malignant neoplasm: Secondary | ICD-10-CM

## 2019-12-07 NOTE — Progress Notes (Signed)
Subjective:     Dale Terrell  Patient here for postoperative visit.  Denies any nausea, vomiting, fever, chills, or abdominal pain. Objective:    BP 116/70 (BP Location: Left Arm, Patient Position: Sitting, Cuff Size: Normal)   Pulse 91   Temp 98.4 F (36.9 C) (Oral)   Resp 16   Ht 5\' 7"  (1.702 m)   Wt 185 lb (83.9 kg)   SpO2 97%   BMI 28.98 kg/m   General:  alert, cooperative and no distress  Abdomen is soft, incisions have healed well.     Assessment:    Doing well postoperatively.    Plan:   At this point, I do not feel that additional surgery to remove the remaining gallbladder is warranted at this time.  He is asymptomatic.  He is trying to find low income housing.  I told him to return here should he develop worsening symptoms.  Follow-up here as needed.

## 2020-01-01 ENCOUNTER — Other Ambulatory Visit: Payer: Self-pay | Admitting: Gastroenterology

## 2020-01-03 NOTE — Telephone Encounter (Signed)
Needs office visit prior to additional refills. I sent in one month supply.

## 2020-01-04 ENCOUNTER — Encounter: Payer: Self-pay | Admitting: Gastroenterology

## 2020-02-02 DIAGNOSIS — E119 Type 2 diabetes mellitus without complications: Secondary | ICD-10-CM | POA: Diagnosis not present

## 2020-02-02 DIAGNOSIS — I10 Essential (primary) hypertension: Secondary | ICD-10-CM | POA: Diagnosis not present

## 2020-02-02 DIAGNOSIS — Z79899 Other long term (current) drug therapy: Secondary | ICD-10-CM | POA: Diagnosis not present

## 2020-02-02 DIAGNOSIS — E785 Hyperlipidemia, unspecified: Secondary | ICD-10-CM | POA: Diagnosis not present

## 2020-02-17 ENCOUNTER — Telehealth: Payer: Self-pay | Admitting: Gastroenterology

## 2020-02-17 ENCOUNTER — Encounter: Payer: Self-pay | Admitting: Gastroenterology

## 2020-02-17 ENCOUNTER — Ambulatory Visit: Payer: Medicare Other | Admitting: Gastroenterology

## 2020-02-17 NOTE — Telephone Encounter (Signed)
Patient was a no show and letter sent  °

## 2020-05-08 DIAGNOSIS — M79674 Pain in right toe(s): Secondary | ICD-10-CM | POA: Diagnosis not present

## 2020-05-08 DIAGNOSIS — I739 Peripheral vascular disease, unspecified: Secondary | ICD-10-CM | POA: Diagnosis not present

## 2020-05-08 DIAGNOSIS — M79671 Pain in right foot: Secondary | ICD-10-CM | POA: Diagnosis not present

## 2020-05-08 DIAGNOSIS — M79672 Pain in left foot: Secondary | ICD-10-CM | POA: Diagnosis not present

## 2020-05-08 DIAGNOSIS — E1140 Type 2 diabetes mellitus with diabetic neuropathy, unspecified: Secondary | ICD-10-CM | POA: Diagnosis not present

## 2020-08-03 DIAGNOSIS — E119 Type 2 diabetes mellitus without complications: Secondary | ICD-10-CM | POA: Diagnosis not present

## 2020-08-03 DIAGNOSIS — E785 Hyperlipidemia, unspecified: Secondary | ICD-10-CM | POA: Diagnosis not present

## 2020-08-03 DIAGNOSIS — Z23 Encounter for immunization: Secondary | ICD-10-CM | POA: Diagnosis not present

## 2020-08-03 DIAGNOSIS — Z79899 Other long term (current) drug therapy: Secondary | ICD-10-CM | POA: Diagnosis not present

## 2020-08-03 DIAGNOSIS — Z0000 Encounter for general adult medical examination without abnormal findings: Secondary | ICD-10-CM | POA: Diagnosis not present

## 2020-08-03 DIAGNOSIS — I10 Essential (primary) hypertension: Secondary | ICD-10-CM | POA: Diagnosis not present

## 2020-08-07 DIAGNOSIS — M79671 Pain in right foot: Secondary | ICD-10-CM | POA: Diagnosis not present

## 2020-08-07 DIAGNOSIS — M79674 Pain in right toe(s): Secondary | ICD-10-CM | POA: Diagnosis not present

## 2020-08-07 DIAGNOSIS — I739 Peripheral vascular disease, unspecified: Secondary | ICD-10-CM | POA: Diagnosis not present

## 2020-08-07 DIAGNOSIS — E1140 Type 2 diabetes mellitus with diabetic neuropathy, unspecified: Secondary | ICD-10-CM | POA: Diagnosis not present

## 2020-08-07 DIAGNOSIS — M79672 Pain in left foot: Secondary | ICD-10-CM | POA: Diagnosis not present

## 2020-11-06 DIAGNOSIS — E538 Deficiency of other specified B group vitamins: Secondary | ICD-10-CM | POA: Diagnosis not present

## 2020-11-06 DIAGNOSIS — E119 Type 2 diabetes mellitus without complications: Secondary | ICD-10-CM | POA: Diagnosis not present

## 2020-11-07 DIAGNOSIS — M79675 Pain in left toe(s): Secondary | ICD-10-CM | POA: Diagnosis not present

## 2020-11-07 DIAGNOSIS — M79672 Pain in left foot: Secondary | ICD-10-CM | POA: Diagnosis not present

## 2020-11-07 DIAGNOSIS — I739 Peripheral vascular disease, unspecified: Secondary | ICD-10-CM | POA: Diagnosis not present

## 2020-11-07 DIAGNOSIS — E1140 Type 2 diabetes mellitus with diabetic neuropathy, unspecified: Secondary | ICD-10-CM | POA: Diagnosis not present

## 2020-11-07 DIAGNOSIS — M79671 Pain in right foot: Secondary | ICD-10-CM | POA: Diagnosis not present

## 2020-11-07 DIAGNOSIS — M79674 Pain in right toe(s): Secondary | ICD-10-CM | POA: Diagnosis not present

## 2021-01-30 DIAGNOSIS — E1140 Type 2 diabetes mellitus with diabetic neuropathy, unspecified: Secondary | ICD-10-CM | POA: Diagnosis not present

## 2021-01-30 DIAGNOSIS — M79671 Pain in right foot: Secondary | ICD-10-CM | POA: Diagnosis not present

## 2021-01-30 DIAGNOSIS — I739 Peripheral vascular disease, unspecified: Secondary | ICD-10-CM | POA: Diagnosis not present

## 2021-01-30 DIAGNOSIS — M79675 Pain in left toe(s): Secondary | ICD-10-CM | POA: Diagnosis not present

## 2021-01-30 DIAGNOSIS — M79672 Pain in left foot: Secondary | ICD-10-CM | POA: Diagnosis not present

## 2021-01-30 DIAGNOSIS — M79674 Pain in right toe(s): Secondary | ICD-10-CM | POA: Diagnosis not present

## 2021-03-02 DIAGNOSIS — H5213 Myopia, bilateral: Secondary | ICD-10-CM | POA: Diagnosis not present

## 2021-04-24 DIAGNOSIS — M79672 Pain in left foot: Secondary | ICD-10-CM | POA: Diagnosis not present

## 2021-04-24 DIAGNOSIS — E1140 Type 2 diabetes mellitus with diabetic neuropathy, unspecified: Secondary | ICD-10-CM | POA: Diagnosis not present

## 2021-04-24 DIAGNOSIS — M79674 Pain in right toe(s): Secondary | ICD-10-CM | POA: Diagnosis not present

## 2021-04-24 DIAGNOSIS — M79675 Pain in left toe(s): Secondary | ICD-10-CM | POA: Diagnosis not present

## 2021-04-24 DIAGNOSIS — M79671 Pain in right foot: Secondary | ICD-10-CM | POA: Diagnosis not present

## 2021-04-24 DIAGNOSIS — I739 Peripheral vascular disease, unspecified: Secondary | ICD-10-CM | POA: Diagnosis not present

## 2021-05-16 DIAGNOSIS — I10 Essential (primary) hypertension: Secondary | ICD-10-CM | POA: Diagnosis not present

## 2021-05-16 DIAGNOSIS — Z79899 Other long term (current) drug therapy: Secondary | ICD-10-CM | POA: Diagnosis not present

## 2021-05-16 DIAGNOSIS — M5440 Lumbago with sciatica, unspecified side: Secondary | ICD-10-CM | POA: Diagnosis not present

## 2021-05-16 DIAGNOSIS — G8929 Other chronic pain: Secondary | ICD-10-CM | POA: Diagnosis not present

## 2021-05-16 DIAGNOSIS — E785 Hyperlipidemia, unspecified: Secondary | ICD-10-CM | POA: Diagnosis not present

## 2021-05-16 DIAGNOSIS — E119 Type 2 diabetes mellitus without complications: Secondary | ICD-10-CM | POA: Diagnosis not present

## 2021-07-31 DIAGNOSIS — M79675 Pain in left toe(s): Secondary | ICD-10-CM | POA: Diagnosis not present

## 2021-07-31 DIAGNOSIS — M79672 Pain in left foot: Secondary | ICD-10-CM | POA: Diagnosis not present

## 2021-07-31 DIAGNOSIS — M79674 Pain in right toe(s): Secondary | ICD-10-CM | POA: Diagnosis not present

## 2021-07-31 DIAGNOSIS — I739 Peripheral vascular disease, unspecified: Secondary | ICD-10-CM | POA: Diagnosis not present

## 2021-07-31 DIAGNOSIS — Z23 Encounter for immunization: Secondary | ICD-10-CM | POA: Diagnosis not present

## 2021-07-31 DIAGNOSIS — E1140 Type 2 diabetes mellitus with diabetic neuropathy, unspecified: Secondary | ICD-10-CM | POA: Diagnosis not present

## 2021-07-31 DIAGNOSIS — M79671 Pain in right foot: Secondary | ICD-10-CM | POA: Diagnosis not present

## 2021-08-15 DIAGNOSIS — E119 Type 2 diabetes mellitus without complications: Secondary | ICD-10-CM | POA: Diagnosis not present

## 2021-08-15 DIAGNOSIS — Z23 Encounter for immunization: Secondary | ICD-10-CM | POA: Diagnosis not present

## 2021-09-24 ENCOUNTER — Ambulatory Visit (INDEPENDENT_AMBULATORY_CARE_PROVIDER_SITE_OTHER): Payer: Medicare Other | Admitting: Family Medicine

## 2021-09-24 ENCOUNTER — Encounter: Payer: Self-pay | Admitting: Family Medicine

## 2021-09-24 VITALS — BP 115/65 | HR 98 | Temp 98.4°F | Ht 67.0 in | Wt 204.1 lb

## 2021-09-24 DIAGNOSIS — E785 Hyperlipidemia, unspecified: Secondary | ICD-10-CM | POA: Diagnosis not present

## 2021-09-24 DIAGNOSIS — Z7689 Persons encountering health services in other specified circumstances: Secondary | ICD-10-CM | POA: Diagnosis not present

## 2021-09-24 DIAGNOSIS — I152 Hypertension secondary to endocrine disorders: Secondary | ICD-10-CM | POA: Diagnosis not present

## 2021-09-24 DIAGNOSIS — E538 Deficiency of other specified B group vitamins: Secondary | ICD-10-CM | POA: Diagnosis not present

## 2021-09-24 DIAGNOSIS — E1169 Type 2 diabetes mellitus with other specified complication: Secondary | ICD-10-CM

## 2021-09-24 DIAGNOSIS — E1159 Type 2 diabetes mellitus with other circulatory complications: Secondary | ICD-10-CM

## 2021-09-24 DIAGNOSIS — F339 Major depressive disorder, recurrent, unspecified: Secondary | ICD-10-CM | POA: Diagnosis not present

## 2021-09-24 DIAGNOSIS — G8929 Other chronic pain: Secondary | ICD-10-CM | POA: Diagnosis not present

## 2021-09-24 DIAGNOSIS — M545 Low back pain, unspecified: Secondary | ICD-10-CM | POA: Diagnosis not present

## 2021-09-24 NOTE — Progress Notes (Signed)
New Patient Office Visit  Subjective:  Patient ID: Dale Terrell, male    DOB: 05/14/1966  Age: 55 y.o. MRN: [id number]  CC:  Chief Complaint  Patient presents with   New Patient (Initial Visit)   Medical Management of Chronic Issues    HPI Dale Terrell presents for to establish care.   Dale Terrell is transferring care from a [place] office in [place] as our office is closer.   Dale Terrell has a history of T2DM. Dale Terrell checks his blood sugar 2x a day. His fasting blood sugars are usually 100-130. His A1c last month was 6.3. Dale Terrell takes metformin and glimepiride. Dale Terrell sees podiatry regularly. Dale Terrell had an eye exam in Feb of this year.   Dale Terrell takes lisinopril for HTN and simvastatin for his cholesterol. Dale Terrell also takes an oral b12 supplement for b12 deficiency.   Dale Terrell has a history of chronic low back. Dale Terrell takes tizanidine prn for this as needed with good relief.   Dale Terrell takes zoloft for depression. Reports well controlled.   Depression screen [facility] 2/9 09/24/2021  Decreased Interest 0  Down, Depressed, Hopeless 2  PHQ - 2 Score 2  Altered sleeping 2  Tired, decreased energy 0  Change in appetite 0  Feeling bad or failure about yourself  0  Trouble concentrating 0  Moving slowly or fidgety/restless 0  Suicidal thoughts 0  PHQ-9 Score 4  Difficult doing work/chores Somewhat difficult   GAD 7 : Generalized Anxiety Score 09/24/2021  Nervous, Anxious, on Edge 1  Control/stop worrying 0  Worry too much - different things 0  Trouble relaxing 0  Restless 0  Easily annoyed or irritable 0  Afraid - awful might happen 0  Total GAD 7 Score 1  Anxiety Difficulty Not difficult at all       Past Medical History:  Diagnosis Date   Chronic back pain    Depression    on zoloft   Diabetes mellitus without complication ([place])    Hyperlipidemia    Hypertension    Seizure ([place])    as a child, umknown etiology and on no meds ever, only had 1.    Past Surgical History:  Procedure Laterality Date   CHOLECYSTECTOMY  N/A 11/08/2019   Procedure: ATTEMPTED LAPAROSCOPIC CHOLECYSTECTOMY; OPEN PARTIAL CHOLECYSTECTOMY;  Surgeon: Aviva Signs, MD;  Location: [facility];  Service: General;  Laterality: N/A;  LAPAROTOMY AT 1156   COLONOSCOPY WITH PROPOFOL N/A 01/05/2019   Procedure: COLONOSCOPY WITH PROPOFOL;  Surgeon: Danie Binder, MD;  Location: [facility];  Service: Endoscopy;  Laterality: N/A;  12:00pm   cyst on arm Right    ESOPHAGOGASTRODUODENOSCOPY (EGD) WITH PROPOFOL  01/05/2019   Procedure: ESOPHAGOGASTRODUODENOSCOPY (EGD) WITH PROPOFOL;  Surgeon: Danie Binder, MD;  Location: [facility];  Service: Endoscopy;;   POLYPECTOMY  01/05/2019   Procedure: POLYPECTOMY;  Surgeon: Danie Binder, MD;  Location: [facility];  Service: Endoscopy;;    Family History  Problem Relation Age of Onset   Stroke Mother    Arthritis Mother    Heart attack Father        43"s   Hearing loss Maternal Grandmother    Colon cancer Neg Hx    Colon polyps Neg Hx     Social History   Socioeconomic History   Marital status: Single    Spouse name: Not on file   Number of children: 1   Years of education: 12   Highest education level: High school graduate  Occupational History   Not on file  Tobacco Use   Smoking status: Former    Packs/day: 0.25    Years: 2.00    Pack years: 0.50    Types: Cigarettes    Quit date: 2021    Years since quitting: 1.9   Smokeless tobacco: Never  Vaping Use   Vaping Use: Never used  Substance and Sexual Activity   Alcohol use: Not Currently    Comment: in the past   Drug use: No   Sexual activity: Not Currently    Birth control/protection: None  Other Topics Concern   Not on file  Social History Narrative   Not on file   Social Determinants of Health   Financial Resource Strain: Not on file  Food Insecurity: Not on file  Transportation Needs: Not on file  Physical Activity: Not on file  Stress: Not on file  Social Connections: Not on file  Intimate Partner  Violence: Not on file    ROS Review of Systems Negative unless specially indicated above in HPI.  Objective:   Today's Vitals: BP 115/65   Pulse 98   Temp 98.4 F (36.9 C) (Temporal)   Ht 5\' 7"  (1.702 m)   Wt 204 lb 2 oz (92.6 kg)   BMI 31.97 kg/m   Physical Exam Vitals and nursing note reviewed.  Constitutional:      General: Dale Terrell is not in acute distress.    Appearance: Normal appearance. Dale Terrell is not ill-appearing.  Cardiovascular:     Rate and Rhythm: Normal rate and regular rhythm.     Pulses: Normal pulses.     Heart sounds: Normal heart sounds. No murmur heard. Pulmonary:     Effort: Pulmonary effort is normal. No respiratory distress.     Breath sounds: Normal breath sounds.  Abdominal:     General: Bowel sounds are normal. There is no distension.     Palpations: Abdomen is soft. There is no mass.     Tenderness: There is no abdominal tenderness. There is no guarding or rebound.  Musculoskeletal:     Cervical back: Neck supple. No tenderness.     Right lower leg: No edema.     Left lower leg: No edema.  Lymphadenopathy:     Cervical: No cervical adenopathy.  Skin:    General: Skin is warm and dry.  Neurological:     General: No focal deficit present.     Mental Status: Dale Terrell is alert and oriented to person, place, and time.  Psychiatric:        Mood and Affect: Mood normal.        Behavior: Behavior normal.    Assessment & Plan:   Dale Terrell was seen today for new patient (initial visit) and medical management of chronic issues.  Diagnoses and all orders for this visit:  Type 2 diabetes mellitus with hyperlipidemia ([place]) Well controlled on current regimen. Last A1c 6.3. UTD on eye exam and podiatry. Continue metformin and glimepiride for now. Follow up in 8 weeks for chronic follow up with labs.   Hyperlipidemia associated with type 2 diabetes mellitus ([place]) On statin. Will check labs at next visit.   Hypertension associated with diabetes ([place]) Well  controlled on current regimen. On lisinopril.   Depression, recurrent ([place]) Well controlled on current regimen. Continue zoloft.   Vitamin B12 deficiency On oral supplement. Will check at next visit.   Chronic bilateral low back pain, unspecified whether sciatica present Tizanidine prn. Tylenol, ice, heat, stretching.  Encounter to establish care   Follow-up: Return in about 8 weeks (around 11/16/2021) for chronic follow up.  The patient indicates understanding of these issues and agrees with the plan.   Gwenlyn Perking, FNP

## 2021-10-23 ENCOUNTER — Other Ambulatory Visit: Payer: Self-pay | Admitting: Family Medicine

## 2021-10-23 NOTE — Telephone Encounter (Signed)
Historical medication, never prescribed here.  Patient only seen once as new patient on 09/24/21.

## 2021-10-24 MED ORDER — TIZANIDINE HCL 4 MG PO TABS: 4.0000 mg | ORAL_TABLET | Freq: Three times a day (TID) | ORAL | 0 refills | Status: DC | PRN

## 2021-10-24 NOTE — Addendum Note (Signed)
Addended by: Antonietta Barcelona D on: 10/24/2021 02:15 PM   Modules accepted: Orders

## 2021-10-24 NOTE — Telephone Encounter (Signed)
Pt called checking on RF for Tizanidine Med is historical in system, New pt OV on 09/24/21, notes discuss LBP w/ use of Tizanidine, next OV on 11/16/21

## 2021-10-24 NOTE — Telephone Encounter (Signed)
Pt aware refill sent to pharmacy 

## 2021-11-16 ENCOUNTER — Ambulatory Visit: Payer: Medicare Other | Admitting: Family Medicine

## 2021-11-23 ENCOUNTER — Ambulatory Visit (INDEPENDENT_AMBULATORY_CARE_PROVIDER_SITE_OTHER): Payer: Medicare Other

## 2021-11-23 VITALS — Ht 67.0 in | Wt 204.0 lb

## 2021-11-23 DIAGNOSIS — Z0000 Encounter for general adult medical examination without abnormal findings: Secondary | ICD-10-CM

## 2021-11-23 NOTE — Patient Instructions (Signed)
[name] , Thank you for taking time to come for your Medicare Wellness Visit. I appreciate your ongoing commitment to your health goals. Please review the following plan we discussed and let me know if I can assist you in the future.   Screening recommendations/referrals: Colonoscopy: Done 01/05/2019 - Repeat in 10 years Recommended yearly ophthalmology/optometry visit for glaucoma screening and checkup Recommended yearly dental visit for hygiene and checkup  Vaccinations: Influenza vaccine: one 08/15/2021 - Repeat annually Pneumococcal vaccine: Done 11/09/2019 - ask about Prevnar Tdap vaccine: Done 11/03/2017 - Repeat in 10 years Shingles vaccine: Due - get at next visit   Covid-19: Done 02/18/2020, 03/16/2020, & 07/31/2021  Advanced directives: Advance directive discussed with you today. Even though you declined this today, please call our office should you change your mind, and we can give you the proper paperwork for you to fill out.   Conditions/risks identified: Aim for 30 minutes of exercise or brisk walking each day, drink 6-8 glasses of water and eat lots of fruits and vegetables.   Next appointment: Follow up in one year for your annual wellness visit   Preventive Care 40-64 Years, Male Preventive care refers to lifestyle choices and visits with your health care provider that can promote health and wellness. What does preventive care include? A yearly physical exam. This is also called an annual well check. Dental exams once or twice a year. Routine eye exams. Ask your health care provider how often you should have your eyes checked. Personal lifestyle choices, including: Daily care of your teeth and gums. Regular physical activity. Eating a healthy diet. Avoiding tobacco and drug use. Limiting alcohol use. Practicing safe sex. Taking low-dose aspirin every day starting at age 91. What happens during an annual well check? The services and screenings done by your health care  provider during your annual well check will depend on your age, overall health, lifestyle risk factors, and family history of disease. Counseling  Your health care provider may ask you questions about your: Alcohol use. Tobacco use. Drug use. Emotional well-being. Home and relationship well-being. Sexual activity. Eating habits. Work and work Statistician. Screening  You may have the following tests or measurements: Height, weight, and BMI. Blood pressure. Lipid and cholesterol levels. These may be checked every 5 years, or more frequently if you are over 19 years old. Skin check. Lung cancer screening. You may have this screening every year starting at age 24 if you have a 30-pack-year history of smoking and currently smoke or have quit within the past 15 years. Fecal occult blood test (FOBT) of the stool. You may have this test every year starting at age 58. Flexible sigmoidoscopy or colonoscopy. You may have a sigmoidoscopy every 5 years or a colonoscopy every 10 years starting at age 71. Prostate cancer screening. Recommendations will vary depending on your family history and other risks. Hepatitis C blood test. Hepatitis B blood test. Sexually transmitted disease (STD) testing. Diabetes screening. This is done by checking your blood sugar (glucose) after you have not eaten for a while (fasting). You may have this done every 1-3 years. Discuss your test results, treatment options, and if necessary, the need for more tests with your health care provider. Vaccines  Your health care provider may recommend certain vaccines, such as: Influenza vaccine. This is recommended every year. Tetanus, diphtheria, and acellular pertussis (Tdap, Td) vaccine. You may need a Td booster every 10 years. Zoster vaccine. You may need this after age 62. Pneumococcal 13-valent  conjugate (PCV13) vaccine. You may need this if you have certain conditions and have not been vaccinated. Pneumococcal  polysaccharide (PPSV23) vaccine. You may need one or two doses if you smoke cigarettes or if you have certain conditions. Talk to your health care provider about which screenings and vaccines you need and how often you need them. This information is not intended to replace advice given to you by your health care provider. Make sure you discuss any questions you have with your health care provider. Document Released: 11/10/2015 Document Revised: 07/03/2016 Document Reviewed: 08/15/2015 Elsevier Interactive Patient Education  2017 [employer] Prevention in the Home Falls can cause injuries. They can happen to people of all ages. There are many things you can do to make your home safe and to help prevent falls. What can I do on the outside of my home? Regularly fix the edges of walkways and driveways and fix any cracks. Remove anything that might make you trip as you walk through a door, such as a raised step or threshold. Trim any bushes or trees on the path to your home. Use bright outdoor lighting. Clear any walking paths of anything that might make someone trip, such as rocks or tools. Regularly check to see if handrails are loose or broken. Make sure that both sides of any steps have handrails. Any raised decks and porches should have guardrails on the edges. Have any leaves, snow, or ice cleared regularly. Use sand or salt on walking paths during winter. Clean up any spills in your garage right away. This includes oil or grease spills. What can I do in the bathroom? Use night lights. Install grab bars by the toilet and in the tub and shower. Do not use towel bars as grab bars. Use non-skid mats or decals in the tub or shower. If you need to sit down in the shower, use a plastic, non-slip stool. Keep the floor dry. Clean up any water that spills on the floor as soon as it happens. Remove soap buildup in the tub or shower regularly. Attach bath mats securely with double-sided  non-slip rug tape. Do not have throw rugs and other things on the floor that can make you trip. What can I do in the bedroom? Use night lights. Make sure that you have a light by your bed that is easy to reach. Do not use any sheets or blankets that are too big for your bed. They should not hang down onto the floor. Have a firm chair that has side arms. You can use this for support while you get dressed. Do not have throw rugs and other things on the floor that can make you trip. What can I do in the kitchen? Clean up any spills right away. Avoid walking on wet floors. Keep items that you use a lot in easy-to-reach places. If you need to reach something above you, use a strong step stool that has a grab bar. Keep electrical cords out of the way. Do not use floor polish or wax that makes floors slippery. If you must use wax, use non-skid floor wax. Do not have throw rugs and other things on the floor that can make you trip. What can I do with my stairs? Do not leave any items on the stairs. Make sure that there are handrails on both sides of the stairs and use them. Fix handrails that are broken or loose. Make sure that handrails are as long as the stairways. Check  any carpeting to make sure that it is firmly attached to the stairs. Fix any carpet that is loose or worn. Avoid having throw rugs at the top or bottom of the stairs. If you do have throw rugs, attach them to the floor with carpet tape. Make sure that you have a light switch at the top of the stairs and the bottom of the stairs. If you do not have them, ask someone to add them for you. What else can I do to help prevent falls? Wear shoes that: Do not have high heels. Have rubber bottoms. Are comfortable and fit you well. Are closed at the toe. Do not wear sandals. If you use a stepladder: Make sure that it is fully opened. Do not climb a closed stepladder. Make sure that both sides of the stepladder are locked into place. Ask  someone to hold it for you, if possible. Clearly mark and make sure that you can see: Any grab bars or handrails. First and last steps. Where the edge of each step is. Use tools that help you move around (mobility aids) if they are needed. These include: Canes. Walkers. Scooters. Crutches. Turn on the lights when you go into a dark area. Replace any light bulbs as soon as they burn out. Set up your furniture so you have a clear path. Avoid moving your furniture around. If any of your floors are uneven, fix them. If there are any pets around you, be aware of where they are. Review your medicines with your doctor. Some medicines can make you feel dizzy. This can increase your chance of falling. Ask your doctor what other things that you can do to help prevent falls. This information is not intended to replace advice given to you by your health care provider. Make sure you discuss any questions you have with your health care provider. Document Released: 08/10/2009 Document Revised: 03/21/2016 Document Reviewed: 11/18/2014 Elsevier Interactive Patient Education  2017 [employer].

## 2021-11-23 NOTE — Progress Notes (Addendum)
Subjective:   Dale Terrell is a 56 y.o. male who presents for Medicare Annual/Subsequent preventive examination.  Virtual Visit via Telephone Note  I connected with  Dale Terrell on 11/23/21 at 12:00 PM EST by telephone and verified that I am speaking with the correct person using two identifiers.  Location: Patient: Home Provider: [facility] Persons participating in the virtual visit: patient/Nurse Health Advisor   I discussed the limitations, risks, security and privacy concerns of performing an evaluation and management service by telephone and the availability of in person appointments. The patient expressed understanding and agreed to proceed.  Interactive audio and video telecommunications were attempted between this nurse and patient, however failed, due to patient having technical difficulties OR patient did not have access to video capability.  We continued and completed visit with audio only.  Some vital signs may be absent or patient reported.   Dale Terrell E Dale Javid, LPN   Review of Systems     Cardiac Risk Factors include: advanced age (>58men, >83 women);diabetes mellitus;dyslipidemia;hypertension;male gender;sedentary lifestyle;obesity (BMI >30kg/m2);family history of premature cardiovascular disease     Objective:    Today's Vitals   11/23/21 1155  Weight: 204 lb (92.5 kg)  Height: 5\' 7"  (1.702 m)   Body mass index is 31.95 kg/m.  Advanced Directives 11/23/2021 11/08/2019 11/05/2019 01/05/2019 12/30/2018 01/24/2016 05/08/2015  Does Patient Have a Medical Advance Directive? No - No No No No No  Would patient like information on creating a medical advance directive? No - Patient declined No - Patient declined No - Patient declined No - Patient declined No - Patient declined No - patient declined information No - patient declined information  Pre-existing out of facility DNR order (yellow form or pink MOST form) - - - - - - -    Current Medications (verified) Outpatient  Encounter Medications as of 11/23/2021  Medication Sig   aspirin EC 81 MG tablet Take 81 mg by mouth daily.   glimepiride (AMARYL) 4 MG tablet Take 8 mg by mouth every morning.   lisinopril (PRINIVIL,ZESTRIL) 5 MG tablet Take 5 mg by mouth daily.   metFORMIN (GLUCOPHAGE) 1000 MG tablet Take 1,000 mg by mouth 2 (two) times daily with a meal.   sertraline (ZOLOFT) 100 MG tablet Take 1 tablet by mouth daily.   simvastatin (ZOCOR) 40 MG tablet Take 40 mg by mouth at bedtime.   tiZANidine (ZANAFLEX) 4 MG tablet Take 1 tablet (4 mg total) by mouth every 8 (eight) hours as needed.   No facility-administered encounter medications on file as of 11/23/2021.    Allergies (verified) Bee venom and Ciprofloxacin   History: Past Medical History:  Diagnosis Date   Chronic back pain    Depression    on zoloft   Diabetes mellitus without complication ([place])    Hyperlipidemia    Hypertension    Seizure ([place])    as a child, umknown etiology and on no meds ever, only had 1.   Past Surgical History:  Procedure Laterality Date   CHOLECYSTECTOMY N/A 11/08/2019   Procedure: ATTEMPTED LAPAROSCOPIC CHOLECYSTECTOMY; OPEN PARTIAL CHOLECYSTECTOMY;  Surgeon: Aviva Signs, MD;  Location: [facility];  Service: General;  Laterality: N/A;  LAPAROTOMY AT 1156   COLONOSCOPY WITH PROPOFOL N/A 01/05/2019   Procedure: COLONOSCOPY WITH PROPOFOL;  Surgeon: Danie Binder, MD;  Location: [facility];  Service: Endoscopy;  Laterality: N/A;  12:00pm   cyst on arm Right    ESOPHAGOGASTRODUODENOSCOPY (EGD) WITH PROPOFOL  01/05/2019  Procedure: ESOPHAGOGASTRODUODENOSCOPY (EGD) WITH PROPOFOL;  Surgeon: Danie Binder, MD;  Location: [facility];  Service: Endoscopy;;   POLYPECTOMY  01/05/2019   Procedure: POLYPECTOMY;  Surgeon: Danie Binder, MD;  Location: [facility];  Service: Endoscopy;;   Family History  Problem Relation Age of Onset   Stroke Mother    Arthritis Mother    Heart attack Father        3"s   Hearing  loss Maternal Grandmother    Colon cancer Neg Hx    Colon polyps Neg Hx    Social History   Socioeconomic History   Marital status: Single    Spouse name: Not on file   Number of children: 1   Years of education: 12   Highest education level: High school graduate  Occupational History   Occupation: disabled  Tobacco Use   Smoking status: Former    Packs/day: 0.25    Years: 2.00    Pack years: 0.50    Types: Cigarettes    Quit date: 2021    Years since quitting: 2.0   Smokeless tobacco: Never  Vaping Use   Vaping Use: Never used  Substance and Sexual Activity   Alcohol use: Not Currently    Comment: in the past   Drug use: No   Sexual activity: Not Currently    Birth control/protection: None  Other Topics Concern   Not on file  Social History Narrative   Lives with his aunt and brother   Social Determinants of Health   Financial Resource Strain: Low Risk    Difficulty of Paying Living Expenses: Not hard at all  Food Insecurity: No Food Insecurity   Worried About Charity fundraiser in the Last Year: Never true   Arboriculturist in the Last Year: Never true  Transportation Needs: No Transportation Needs   Lack of Transportation (Medical): No   Lack of Transportation (Non-Medical): No  Physical Activity: Sufficiently Active   Days of Exercise per Week: 5 days   Minutes of Exercise per Session: 30 min  Stress: No Stress Concern Present   Feeling of Stress : Not at all  Social Connections: Moderately Integrated   Frequency of Communication with Friends and Family: More than three times a week   Frequency of Social Gatherings with Friends and Family: More than three times a week   Attends Religious Services: 1 to 4 times per year   Active Member of [employer] or Organizations: Yes   Attends Archivist Meetings: 1 to 4 times per year   Marital Status: Divorced    Tobacco Counseling Counseling given: Not Answered   Clinical Intake:  Pre-visit preparation  completed: Yes  Pain : No/denies pain     BMI - recorded: 31.95 Nutritional Status: BMI > 30  Obese Nutritional Risks: None Diabetes: Yes CBG done?: No Did pt. bring in CBG monitor from home?: No  How often do you need to have someone help you when you read instructions, pamphlets, or other written materials from your doctor or pharmacy?: 1 - Never  Diabetic? Nutrition Risk Assessment:  Has the patient had any N/V/D within the last 2 months?  No  Does the patient have any non-healing wounds?  No  Has the patient had any unintentional weight loss or weight gain?  No   Diabetes:  Is the patient diabetic?  Yes  If diabetic, was a CBG obtained today?  No  Did the patient bring in their  glucometer from home?  No  How often do you monitor your CBG's? BID - 142 this am per patient.   Financial Strains and Diabetes Management:  Are you having any financial strains with the device, your supplies or your medication? No .  Does the patient want to be seen by Chronic Care Management for management of their diabetes?  No  Would the patient like to be referred to a Nutritionist or for Diabetic Management?  No   Diabetic Exams:  Diabetic Eye Exam: Completed 11/2020..  Diabetic Foot Exam: Completed 2022. Pt has been advised about the importance in completing this exam. Pt is scheduled for diabetic foot exam on next visit.    Interpreter Needed?: No  Information entered by :: Constantin Hillery, LPN   Activities of Daily Living In your present state of health, do you have any difficulty performing the following activities: 11/23/2021  Hearing? N  Vision? N  Difficulty concentrating or making decisions? N  Walking or climbing stairs? N  Dressing or bathing? N  Doing errands, shopping? N  Preparing Food and eating ? N  Using the Toilet? N  In the past six months, have you accidently leaked urine? N  Do you have problems with loss of bowel control? N  Managing your Medications? N   Managing your Finances? N  Housekeeping or managing your Housekeeping? N  Some recent data might be hidden    Patient Care Team: Gwenlyn Perking, FNP as PCP - General (Family Medicine) Danie Binder, MD (Inactive) as Consulting Physician (Gastroenterology)  Indicate any recent Medical Services you may have received from other than [facility] providers in the past year (date may be approximate).     Assessment:   This is a routine wellness examination for Ar[place].  Hearing/Vision screen Hearing Screening - Comments:: Denies hearing difficulties   Vision Screening - Comments:: Wears rx glasses - up to date with routine [facility] exams [facility] in [place]  Dietary issues and exercise activities discussed: Current Exercise Habits: Home exercise routine, Type of exercise: walking;strength training/weights, Time (Minutes): 20, Frequency (Times/Week): 5, Weekly Exercise (Minutes/Week): 100, Intensity: Mild, Exercise limited by: orthopedic condition(s)   Goals Addressed             This Visit's Progress    Exercise 150 min/wk Moderate Activity         Depression Screen PHQ 2/9 Scores 11/23/2021 09/24/2021  PHQ - 2 Score 2 2  PHQ- 9 Score 5 4    Fall Risk Fall Risk  11/23/2021 09/24/2021  Falls in the past year? 0 0  Number falls in past yr: 0 -  Injury with Fall? 0 -  Risk for fall due to : Orthopedic patient;Medication side effect -  Follow up Falls prevention discussed -    FALL RISK PREVENTION PERTAINING TO THE HOME:  Any stairs in or around the home? Yes  If so, are there any without handrails? No  Home free of loose throw rugs in walkways, pet beds, electrical cords, etc? Yes  Adequate lighting in your home to reduce risk of falls? Yes   ASSISTIVE DEVICES UTILIZED TO PREVENT FALLS:  Life alert? No  Use of a cane, walker or w/c? Yes  Grab bars in the bathroom? Yes  Shower chair or bench in shower? No  Elevated toilet seat or a handicapped toilet? No    TIMED UP AND GO:  Was the test performed? No . Telephonic visit  Cognitive Function:  6CIT Screen 11/23/2021  What Year? 0 points  What month? 0 points  What time? 0 points  Count back from 20 0 points  Months in reverse 0 points  Repeat phrase 6 points  Total Score 6    Immunizations Immunization History  Administered Date(s) Administered   Influenza Inj Mdck Quad Pf 11/13/2017, 08/15/2021   Influenza Split 07/28/2014   Influenza,inj,Quad PF,6+ Mos 11/09/2019   Influenza,inj,quad, With Preservative 08/03/2020   Moderna Sars-Covid-2 Vaccination 02/18/2020, 03/16/2020   Pneumococcal Polysaccharide-23 10/18/2014, 11/09/2019   Pneumococcal-Unspecified 11/09/2019   Tdap 11/13/2017    TDAP status: Up to date  Flu Vaccine status: Up to date  Pneumococcal vaccine status: Up to date  Covid-19 vaccine status: Completed vaccines  Qualifies for Shingles Vaccine? Yes   Zostavax completed No   Shingrix Completed?: No.    Education has been provided regarding the importance of this vaccine. Patient has been advised to call insurance company to determine out of pocket expense if they have not yet received this vaccine. Advised may also receive vaccine at local pharmacy or Health Dept. Verbalized acceptance and understanding.  Screening Tests Health Maintenance  Topic Date Due   FOOT EXAM  Never done   OPHTHALMOLOGY EXAM  Never done   Zoster Vaccines- Shingrix (1 of 2) Never done   HEMOGLOBIN A1C  05/05/2020   Hepatitis C Screening  09/24/2022 (Originally 04/19/1984)   COVID-19 Vaccine (3 - Moderna risk series) 10/10/2022 (Originally 04/13/2020)   TETANUS/TDAP  11/14/2027   COLONOSCOPY (Pts 45-78yrs Insurance coverage will need to be confirmed)  01/04/2029   INFLUENZA VACCINE  Completed   HIV Screening  Completed   HPV VACCINES  Aged Out    Health Maintenance  Health Maintenance Due  Topic Date Due   FOOT EXAM  Never done   OPHTHALMOLOGY EXAM  Never done   Zoster  Vaccines- Shingrix (1 of 2) Never done   HEMOGLOBIN A1C  05/05/2020    Colorectal cancer screening: Type of screening: Colonoscopy. Completed 01/05/2019. Repeat every 10 years  Lung Cancer Screening: (Low Dose CT Chest recommended if Age 64-80 years, 30 pack-year currently smoking OR have quit w/in 15years.) does not qualify.   Additional Screening:  Hepatitis C Screening: does qualify; due  Vision Screening: Recommended annual ophthalmology exams for early detection of glaucoma and other disorders of the eye. Is the patient up to date with their annual eye exam?  Yes  Who is the provider or what is the name of the office in which the patient attends annual eye exams? [facility], Dr Jorja Loa If pt is not established with a provider, would they like to be referred to a provider to establish care? No .   Dental Screening: Recommended annual dental exams for proper oral hygiene  Community Resource Referral / Chronic Care Management: CRR required this visit?  No   CCM required this visit?  No      Plan:     I have personally reviewed and noted the following in the patients chart:   Medical and social history Use of alcohol, tobacco or illicit drugs  Current medications and supplements including opioid prescriptions. Patient is not currently taking opioid prescriptions. Functional ability and status Nutritional status Physical activity Advanced directives List of other physicians Hospitalizations, surgeries, and ER visits in previous 12 months Vitals Screenings to include cognitive, depression, and falls Referrals and appointments  In addition, I have reviewed and discussed with patient certain preventive protocols, quality metrics, and best practice recommendations.  A written personalized care plan for preventive services as well as general preventive health recommendations were provided to patient.     Sandrea Hammond, LPN   [id number]   Nurse Notes: None  I have  reviewed and agree with the above AWV documentation Caryl Pina, MD [place] 11/28/2021, 12:56 PM

## 2021-11-27 ENCOUNTER — Ambulatory Visit (INDEPENDENT_AMBULATORY_CARE_PROVIDER_SITE_OTHER): Payer: Medicare Other | Admitting: Family Medicine

## 2021-11-27 ENCOUNTER — Encounter: Payer: Self-pay | Admitting: Family Medicine

## 2021-11-27 VITALS — BP 127/69 | HR 85 | Temp 97.1°F | Ht 67.0 in | Wt 207.0 lb

## 2021-11-27 DIAGNOSIS — E785 Hyperlipidemia, unspecified: Secondary | ICD-10-CM

## 2021-11-27 DIAGNOSIS — I152 Hypertension secondary to endocrine disorders: Secondary | ICD-10-CM | POA: Diagnosis not present

## 2021-11-27 DIAGNOSIS — Z23 Encounter for immunization: Secondary | ICD-10-CM | POA: Diagnosis not present

## 2021-11-27 DIAGNOSIS — E1169 Type 2 diabetes mellitus with other specified complication: Secondary | ICD-10-CM | POA: Diagnosis not present

## 2021-11-27 DIAGNOSIS — E1159 Type 2 diabetes mellitus with other circulatory complications: Secondary | ICD-10-CM | POA: Diagnosis not present

## 2021-11-27 LAB — BAYER DCA HB A1C WAIVED: HB A1C (BAYER DCA - WAIVED): 6.6 % — ABNORMAL HIGH (ref 4.8–5.6)

## 2021-11-27 NOTE — Patient Instructions (Addendum)

## 2021-11-27 NOTE — Progress Notes (Signed)
Subjective:  Patient ID: Dale Terrell, male    DOB: Apr 14, 1966, 56 y.o.   MRN: [id number]  Patient Care Team: Gwenlyn Perking, FNP as PCP - General (Family Medicine) Danie Binder, MD (Inactive) as Consulting Physician (Gastroenterology)   Chief Complaint:  Medical Management of Chronic Issues   HPI: Dale Terrell is a 56 y.o. male presenting on 11/27/2021 for Medical Management of Chronic Issues   1. Type 2 diabetes mellitus with hyperlipidemia (HCC) Pt presents for follow up evaluation of Type 2 diabetes mellitus.  Current symptoms include none. Patient denies foot ulcerations, hyperglycemia, hypoglycemia , increased appetite, nausea, paresthesia of the feet, polydipsia, polyuria, visual disturbances, vomiting, and weight loss.  Current diabetic medications include metformin and glimepiride Compliant with meds - Yes  Current monitoring regimen: home blood tests - daily Home blood sugar records: trend: stable and in the 140 range most days Any episodes of hypoglycemia? no  Known diabetic complications: cardiovascular disease Cardiovascular risk factors: advanced age (older than 4 for men, 63 for women), diabetes mellitus, dyslipidemia, hypertension, male gender, and obesity (BMI >= 30 kg/m2) Eye exam current (within one year): yes Podiatry yearly?  Yes Weight trend: stable Current diet: in general, a "healthy" diet   Current exercise: none Is He on ACE inhibitor or angiotensin II receptor blocker?  Yes, lisinopril Is He on statin? Yes simvastatin Is He on ASA 81 mg daily?  Yes   2. Hyperlipidemia associated with type 2 diabetes mellitus ([place]) Compliant with medications - Yes Current medications - simvastatin Side effects from medications - No Diet - generally healthy Exercise - none  3. Hypertension associated with diabetes ([place]) Complaint with meds - Yes Current Medications - lisinopril Checking BP at home - no Exercising Regularly - No Watching Salt  intake - Yes Pertinent ROS:  Headache - No Fatigue - No Visual Disturbances - No Chest pain - No Dyspnea - No Palpitations - No LE edema - No They report good compliance with medications and can restate their regimen by memory. No medication side effects.     Relevant past medical, surgical, family, and social history reviewed and updated as indicated.  Allergies and medications reviewed and updated. Data reviewed: Chart in Epic.   Past Medical History:  Diagnosis Date   Chronic back pain    Depression    on zoloft   Diabetes mellitus without complication ([place])    Hyperlipidemia    Hypertension    Seizure ([place])    as a child, umknown etiology and on no meds ever, only had 1.    Past Surgical History:  Procedure Laterality Date   CHOLECYSTECTOMY N/A 11/08/2019   Procedure: ATTEMPTED LAPAROSCOPIC CHOLECYSTECTOMY; OPEN PARTIAL CHOLECYSTECTOMY;  Surgeon: Aviva Signs, MD;  Location: [facility];  Service: General;  Laterality: N/A;  LAPAROTOMY AT 1156   COLONOSCOPY WITH PROPOFOL N/A 01/05/2019   Procedure: COLONOSCOPY WITH PROPOFOL;  Surgeon: Danie Binder, MD;  Location: [facility];  Service: Endoscopy;  Laterality: N/A;  12:00pm   cyst on arm Right    ESOPHAGOGASTRODUODENOSCOPY (EGD) WITH PROPOFOL  01/05/2019   Procedure: ESOPHAGOGASTRODUODENOSCOPY (EGD) WITH PROPOFOL;  Surgeon: Danie Binder, MD;  Location: [facility];  Service: Endoscopy;;   POLYPECTOMY  01/05/2019   Procedure: POLYPECTOMY;  Surgeon: Danie Binder, MD;  Location: [facility];  Service: Endoscopy;;    Social History   Socioeconomic History   Marital status: Single    Spouse name: Not on file  Number of children: 1   Years of education: 12   Highest education level: High school graduate  Occupational History   Occupation: disabled  Tobacco Use   Smoking status: Former    Packs/day: 0.25    Years: 2.00    Pack years: 0.50    Types: Cigarettes    Quit date: 2021    Years since quitting:  2.0   Smokeless tobacco: Never  Vaping Use   Vaping Use: Never used  Substance and Sexual Activity   Alcohol use: Not Currently    Comment: in the past   Drug use: No   Sexual activity: Not Currently    Birth control/protection: None  Other Topics Concern   Not on file  Social History Narrative   Lives with his aunt and brother   Social Determinants of Health   Financial Resource Strain: Low Risk    Difficulty of Paying Living Expenses: Not hard at all  Food Insecurity: No Food Insecurity   Worried About Charity fundraiser in the Last Year: Never true   Arboriculturist in the Last Year: Never true  Transportation Needs: No Transportation Needs   Lack of Transportation (Medical): No   Lack of Transportation (Non-Medical): No  Physical Activity: Sufficiently Active   Days of Exercise per Week: 5 days   Minutes of Exercise per Session: 30 min  Stress: No Stress Concern Present   Feeling of Stress : Not at all  Social Connections: Moderately Integrated   Frequency of Communication with Friends and Family: More than three times a week   Frequency of Social Gatherings with Friends and Family: More than three times a week   Attends Religious Services: 1 to 4 times per year   Active Member of [employer] or Organizations: Yes   Attends Archivist Meetings: 1 to 4 times per year   Marital Status: Divorced  Human resources officer Violence: Not At Risk   Fear of Current or Ex-Partner: No   Emotionally Abused: No   Physically Abused: No   Sexually Abused: No    Outpatient Encounter Medications as of 11/27/2021  Medication Sig   aspirin EC 81 MG tablet Take 81 mg by mouth daily.   glimepiride (AMARYL) 4 MG tablet Take 8 mg by mouth every morning.   lisinopril (PRINIVIL,ZESTRIL) 5 MG tablet Take 5 mg by mouth daily.   metFORMIN (GLUCOPHAGE) 1000 MG tablet Take 1,000 mg by mouth 2 (two) times daily with a meal.   sertraline (ZOLOFT) 100 MG tablet Take 1 tablet by mouth daily.    simvastatin (ZOCOR) 40 MG tablet Take 40 mg by mouth at bedtime.   tiZANidine (ZANAFLEX) 4 MG tablet Take 1 tablet (4 mg total) by mouth every 8 (eight) hours as needed.   No facility-administered encounter medications on file as of 11/27/2021.    Allergies  Allergen Reactions   Bee Venom Itching and Swelling   Ciprofloxacin Hives and Itching    Review of Systems  Constitutional:  Negative for activity change, appetite change, chills, diaphoresis, fatigue, fever and unexpected weight change.  HENT: Negative.    Eyes: Negative.  Negative for photophobia and visual disturbance.  Respiratory:  Negative for cough, chest tightness and shortness of breath.   Cardiovascular:  Negative for chest pain, palpitations and leg swelling.  Gastrointestinal:  Negative for abdominal pain, blood in stool, constipation, diarrhea, nausea and vomiting.  Endocrine: Negative.  Negative for polydipsia, polyphagia and polyuria.  Genitourinary:  Negative for  decreased urine volume, difficulty urinating, dysuria, frequency and urgency.  Musculoskeletal:  Negative for arthralgias and myalgias.  Skin: Negative.   Allergic/Immunologic: Negative.   Neurological:  Negative for dizziness, weakness and headaches.  Hematological: Negative.   Psychiatric/Behavioral:  Negative for confusion, hallucinations, sleep disturbance and suicidal ideas.   All other systems reviewed and are negative.      Objective:  BP 127/69    Pulse 85    Temp (!) 97.1 F (36.2 C) (Temporal)    Ht '5\' 7"'  (1.702 m)    Wt 207 lb (93.9 kg)    SpO2 99%    BMI 32.42 kg/m    Wt Readings from Last 3 Encounters:  11/27/21 207 lb (93.9 kg)  11/23/21 204 lb (92.5 kg)  09/24/21 204 lb 2 oz (92.6 kg)    Physical Exam Vitals and nursing note reviewed.  Constitutional:      General: He is not in acute distress.    Appearance: Normal appearance. He is well-developed and well-groomed. He is obese. He is not ill-appearing, toxic-appearing or  diaphoretic.  HENT:     Head: Normocephalic and atraumatic.     Jaw: There is normal jaw occlusion.     Right Ear: Hearing normal.     Left Ear: Hearing normal.     Nose: Nose normal.     Mouth/Throat:     Lips: Pink.     Mouth: Mucous membranes are moist.     Pharynx: Oropharynx is clear. Uvula midline.  Eyes:     General: Lids are normal.     Extraocular Movements: Extraocular movements intact.     Conjunctiva/sclera: Conjunctivae normal.     Pupils: Pupils are equal, round, and reactive to light.  Neck:     Thyroid: No thyroid mass, thyromegaly or thyroid tenderness.     Vascular: No carotid bruit or JVD.     Trachea: Trachea and phonation normal.  Cardiovascular:     Rate and Rhythm: Normal rate and regular rhythm.     Chest Wall: PMI is not displaced.     Pulses: Normal pulses.          Dorsalis pedis pulses are 2+ on the right side and 2+ on the left side.       Posterior tibial pulses are 2+ on the right side and 2+ on the left side.     Heart sounds: Normal heart sounds. No murmur heard.   No friction rub. No gallop.  Pulmonary:     Effort: Pulmonary effort is normal. No respiratory distress.     Breath sounds: Normal breath sounds. No wheezing.  Abdominal:     General: Bowel sounds are normal. There is no distension or abdominal bruit.     Palpations: Abdomen is soft. There is no hepatomegaly or splenomegaly.     Tenderness: There is no abdominal tenderness. There is no right CVA tenderness or left CVA tenderness.     Hernia: No hernia is present.  Musculoskeletal:        General: Normal range of motion.     Cervical back: Normal range of motion and neck supple.     Right lower leg: No edema.     Left lower leg: No edema.  Feet:     Right foot:     Protective Sensation: 10 sites tested.  10 sites sensed.     Skin integrity: Dry skin present.     Toenail Condition: Right toenails are abnormally thick. Fungal disease present.  Left foot:     Protective  Sensation: 10 sites tested.  10 sites sensed.     Skin integrity: Dry skin present.     Toenail Condition: Left toenails are abnormally thick. Fungal disease present. Lymphadenopathy:     Cervical: No cervical adenopathy.  Skin:    General: Skin is warm and dry.     Capillary Refill: Capillary refill takes less than 2 seconds.     Coloration: Skin is not cyanotic, jaundiced or pale.     Findings: No rash.  Neurological:     General: No focal deficit present.     Mental Status: He is alert and oriented to person, place, and time.     Sensory: Sensation is intact.     Motor: Motor function is intact.     Coordination: Coordination is intact.     Gait: Gait is intact.     Deep Tendon Reflexes: Reflexes are normal and symmetric.  Psychiatric:        Attention and Perception: Attention and perception normal.        Mood and Affect: Mood and affect normal.        Speech: Speech normal.        Behavior: Behavior normal. Behavior is cooperative.        Thought Content: Thought content normal.        Cognition and Memory: Cognition and memory normal.        Judgment: Judgment normal.    Results for orders placed or performed in visit on 11/27/21  Bayer DCA Hb A1c Waived  Result Value Ref Range   HB A1C (BAYER DCA - WAIVED) 6.6 (H) 4.8 - 5.6 %       Pertinent labs & imaging results that were available during my care of the patient were reviewed by me and considered in my medical decision making.  Assessment & Plan:  [place] was seen today for medical management of chronic issues.  Diagnoses and all orders for this visit:  Type 2 diabetes mellitus with hyperlipidemia ([place]) A1C 6.6 in office today. No changes in regimen. Diet and exercise encouraged. Followed by podiatry yearly. Has eye exam scheduled for May -     Bayer [facility] Hb A1c Waived -     CMP14+EGFR  Hyperlipidemia associated with type 2 diabetes mellitus ([place]) Diet and exercise encouraged. Labs pending. Continue statin  therapy.  -     Lipid panel  Hypertension associated with diabetes ([place]) BP well controlled. Changes were not made in regimen today. Goal BP is 130/80. Pt aware to report any persistent high or low readings. DASH diet and exercise encouraged. Exercise at least 150 minutes per week and increase as tolerated. Goal BMI > 25. Stress management encouraged. Avoid nicotine and tobacco product use. Avoid excessive alcohol and NSAID's. Avoid more than 2000 mg of sodium daily. Medications as prescribed. Follow up as scheduled.  -     CBC with Differential/Platelet     Continue all other maintenance medications.  Follow up plan: Return in about 3 months (around 02/24/2022) for with PCP for DM.   Continue healthy lifestyle choices, including diet (rich in fruits, vegetables, and lean proteins, and low in salt and simple carbohydrates) and exercise (at least 30 minutes of moderate physical activity daily).  Educational handout given for DM  The above assessment and management plan was discussed with the patient. The patient verbalized understanding of and has agreed to the management plan. Patient is aware to call the  clinic if they develop any new symptoms or if symptoms persist or worsen. Patient is aware when to return to the clinic for a follow-up visit. Patient educated on when it is appropriate to go to the emergency department.   Monia Pouch, FNP-C [facility] [phone]

## 2021-11-27 NOTE — Addendum Note (Signed)
Addended byCarrolyn Leigh on: 11/27/2021 01:50 PM   Modules accepted: Orders

## 2021-11-28 LAB — CMP14+EGFR
ALT: 37 IU/L (ref 0–44)
AST: 39 IU/L (ref 0–40)
Albumin/Globulin Ratio: 1.7 (ref 1.2–2.2)
Albumin: 4.9 g/dL (ref 3.8–4.9)
Alkaline Phosphatase: 47 IU/L (ref 44–121)
BUN/Creatinine Ratio: 11 (ref 9–20)
BUN: 10 mg/dL (ref 6–24)
Bilirubin Total: 0.3 mg/dL (ref 0.0–1.2)
CO2: 23 mmol/L (ref 20–29)
Calcium: 9.8 mg/dL (ref 8.7–10.2)
Chloride: 99 mmol/L (ref 96–106)
Creatinine, Ser: 0.94 mg/dL (ref 0.76–1.27)
Globulin, Total: 2.9 g/dL (ref 1.5–4.5)
Glucose: 119 mg/dL — ABNORMAL HIGH (ref 70–99)
Potassium: 5.2 mmol/L (ref 3.5–5.2)
Sodium: 139 mmol/L (ref 134–144)
Total Protein: 7.8 g/dL (ref 6.0–8.5)
eGFR: 95 mL/min/{1.73_m2} (ref >59)

## 2021-11-28 LAB — CBC WITH DIFFERENTIAL/PLATELET
Basophils Absolute: 0.1 10*3/uL (ref 0.0–0.2)
Basos: 1 %
EOS (ABSOLUTE): 0.2 10*3/uL (ref 0.0–0.4)
Eos: 2 %
Hematocrit: 43 % (ref 37.5–51.0)
Hemoglobin: 14.8 g/dL (ref 13.0–17.7)
Immature Grans (Abs): 0 10*3/uL (ref 0.0–0.1)
Immature Granulocytes: 0 %
Lymphocytes Absolute: 2 10*3/uL (ref 0.7–3.1)
Lymphs: 25 %
MCH: 29.8 pg (ref 26.6–33.0)
MCHC: 34.4 g/dL (ref 31.5–35.7)
MCV: 87 fL (ref 79–97)
Monocytes Absolute: 0.6 10*3/uL (ref 0.1–0.9)
Monocytes: 8 %
Neutrophils Absolute: 5.1 10*3/uL (ref 1.4–7.0)
Neutrophils: 64 %
Platelets: 252 10*3/uL (ref 150–450)
RBC: 4.96 x10E6/uL (ref 4.14–5.80)
RDW: 12.8 % (ref 11.6–15.4)
WBC: 8 10*3/uL (ref 3.4–10.8)

## 2021-11-28 LAB — LIPID PANEL
Chol/HDL Ratio: 4.2 ratio (ref 0.0–5.0)
Cholesterol, Total: 134 mg/dL (ref 100–199)
HDL: 32 mg/dL — ABNORMAL LOW (ref >39)
LDL Chol Calc (NIH): 82 mg/dL (ref 0–99)
Triglycerides: 109 mg/dL (ref 0–149)
VLDL Cholesterol Cal: 20 mg/dL (ref 5–40)

## 2021-12-06 DIAGNOSIS — M79674 Pain in right toe(s): Secondary | ICD-10-CM | POA: Diagnosis not present

## 2021-12-06 DIAGNOSIS — M79675 Pain in left toe(s): Secondary | ICD-10-CM | POA: Diagnosis not present

## 2021-12-06 DIAGNOSIS — E1140 Type 2 diabetes mellitus with diabetic neuropathy, unspecified: Secondary | ICD-10-CM | POA: Diagnosis not present

## 2021-12-06 DIAGNOSIS — I739 Peripheral vascular disease, unspecified: Secondary | ICD-10-CM | POA: Diagnosis not present

## 2021-12-06 DIAGNOSIS — M79672 Pain in left foot: Secondary | ICD-10-CM | POA: Diagnosis not present

## 2021-12-06 DIAGNOSIS — M79671 Pain in right foot: Secondary | ICD-10-CM | POA: Diagnosis not present

## 2021-12-07 IMAGING — CT CT ABD-PELV W/ CM
2 of 5 series · 15 of 46 positions shown, 17 images · IV contrast (Omnipaque or Isovue)
Comparison: None.

CLINICAL DATA: Left upper quadrant abdominal pain. Nausea and
vomiting and diarrhea.

EXAM:
CT ABDOMEN AND PELVIS WITH CONTRAST
TECHNIQUE: Multidetector CT imaging of the abdomen and pelvis was performed
using the standard protocol following bolus administration of
intravenous contrast.
CONTRAST:  100mL OMNIPAQUE IOHEXOL 300 MG/ML  SOLN

[Series 2: axial st · axial · 0.85mm/px · z∈[+836,+1256]mm · 12 of 100 slices shown, 14 images]
[im 8/100  soft-tissue]
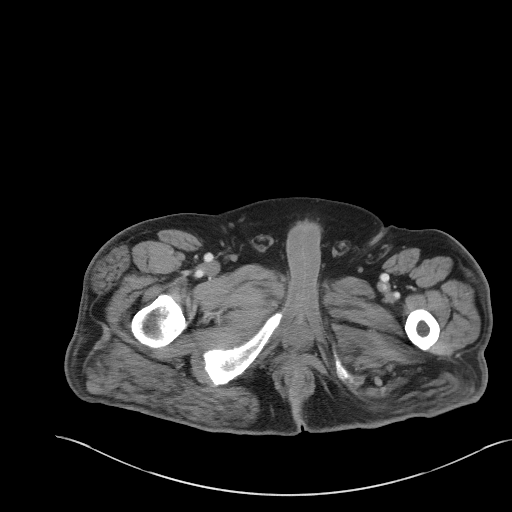
[im 8/100  bone]
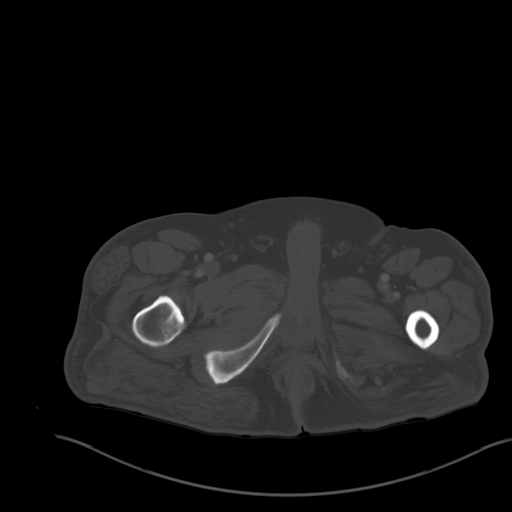
[im 16/100  soft-tissue]
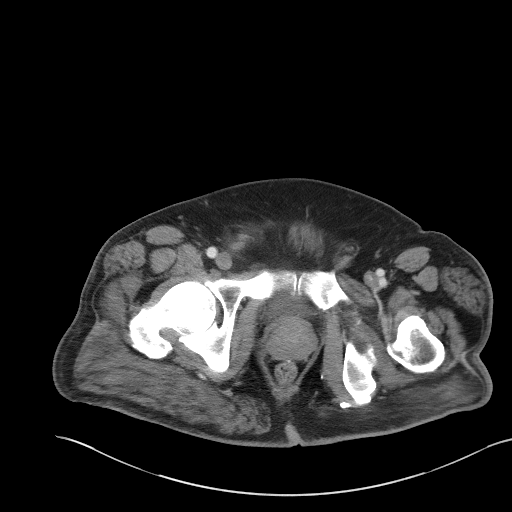
[im 23/100  soft-tissue]
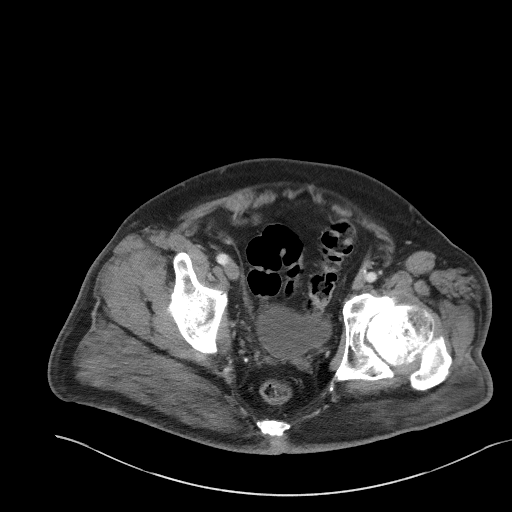
[im 31/100  soft-tissue]
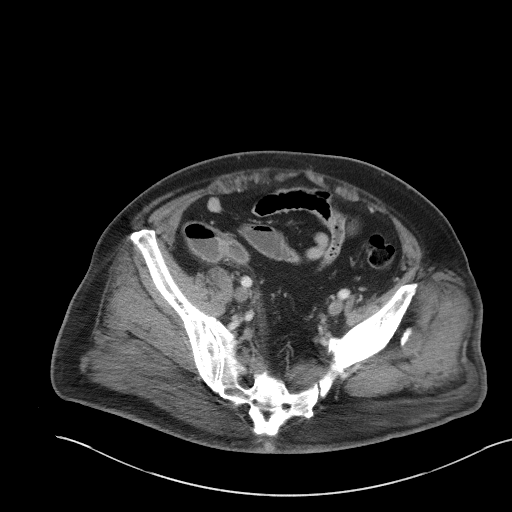
[im 39/100  soft-tissue]
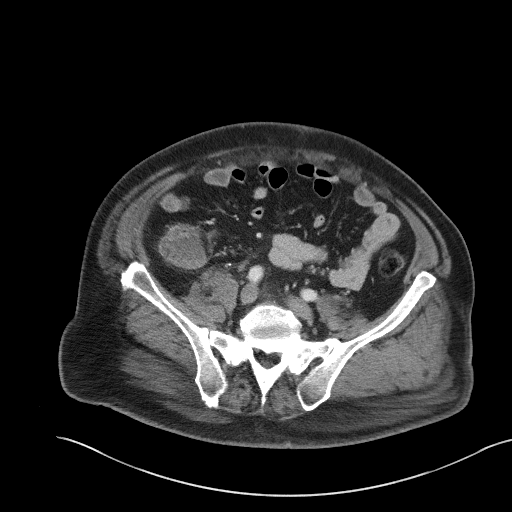
[im 46/100  soft-tissue]
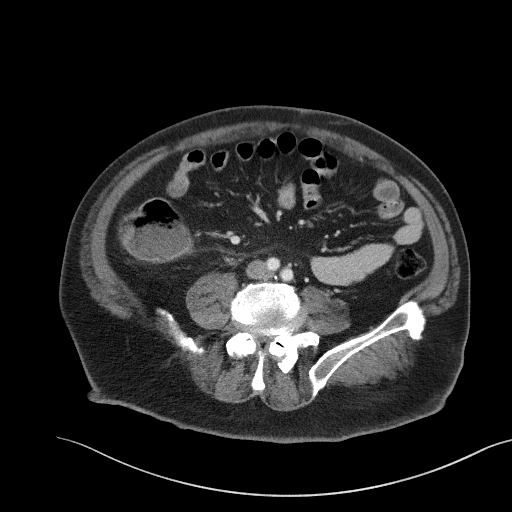
[im 54/100  soft-tissue]
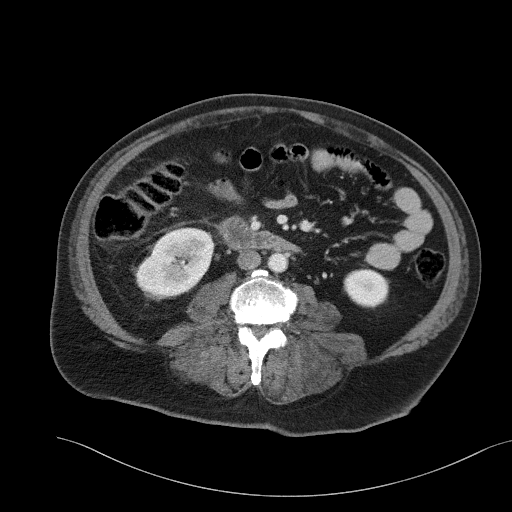
[im 61/100  soft-tissue]
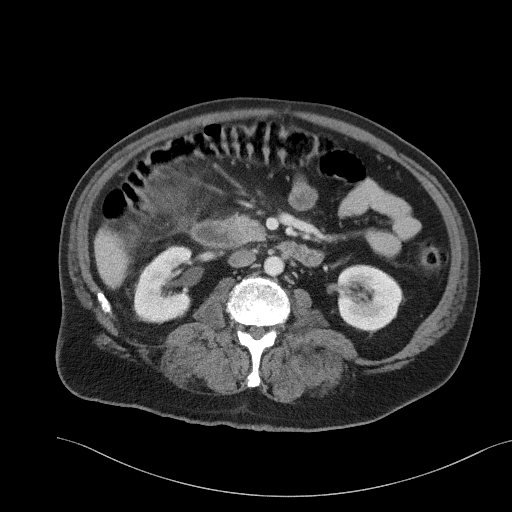
[im 69/100  soft-tissue]
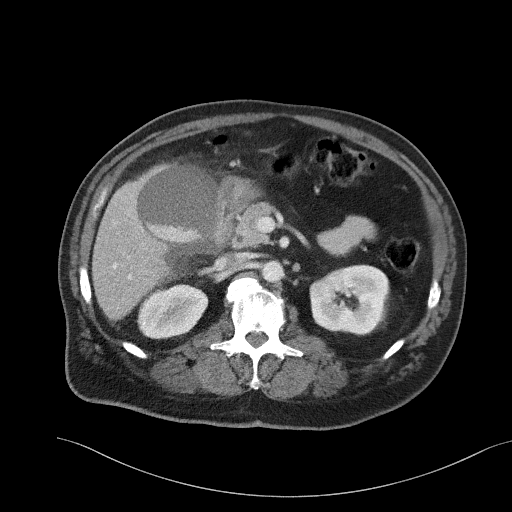
[im 69/100  bone]
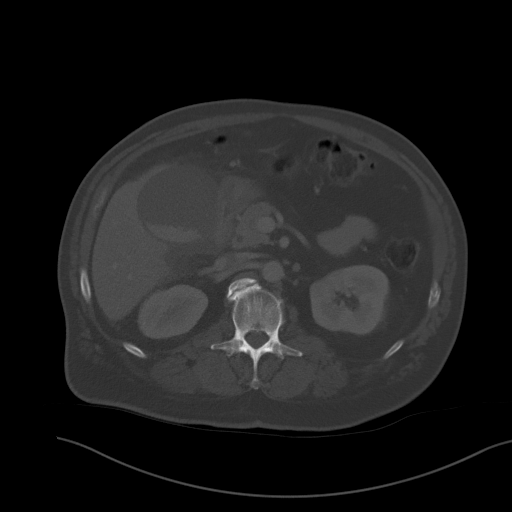
[im 77/100  soft-tissue]
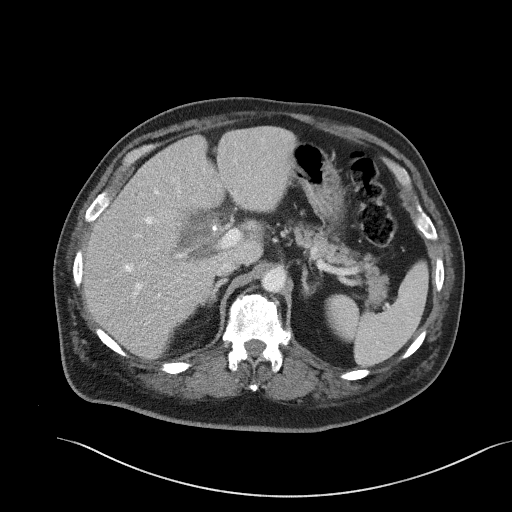
[im 84/100  soft-tissue]
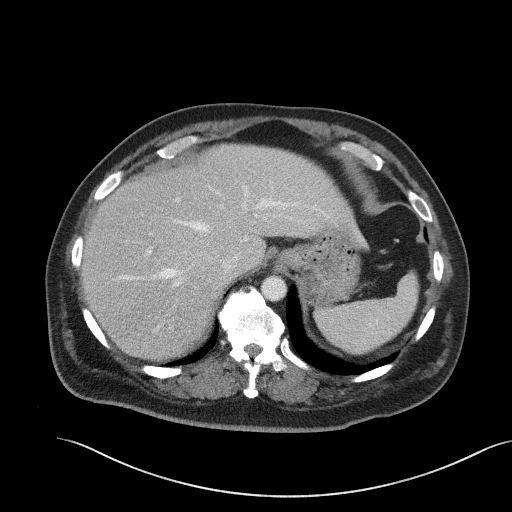
[im 92/100  soft-tissue]
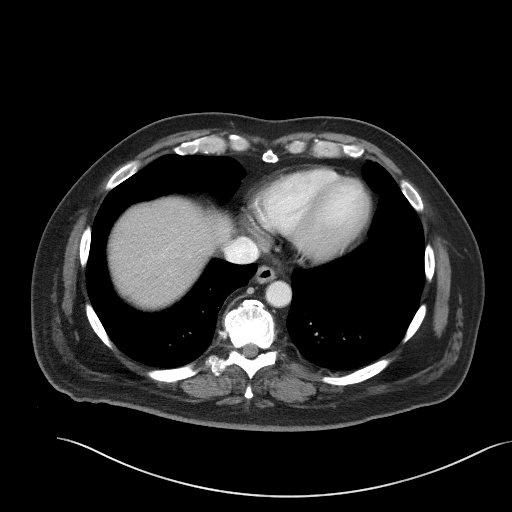

[Series 6: coronal st · coronal · 0.83mm/px · 3 of 107 slices shown]
[im 36/107  soft-tissue]
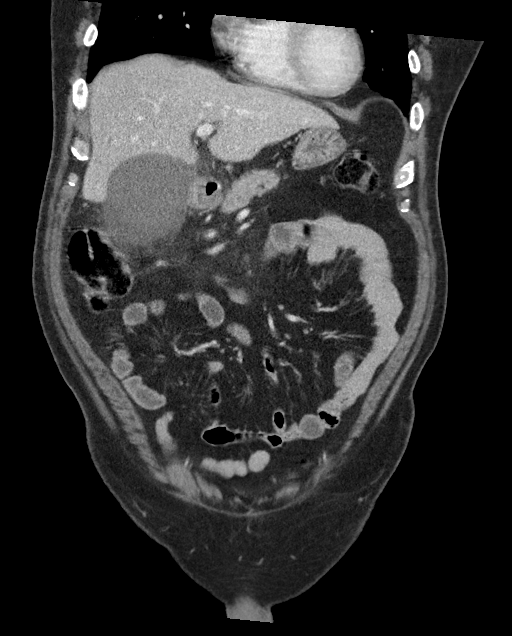
[im 48/107  soft-tissue]
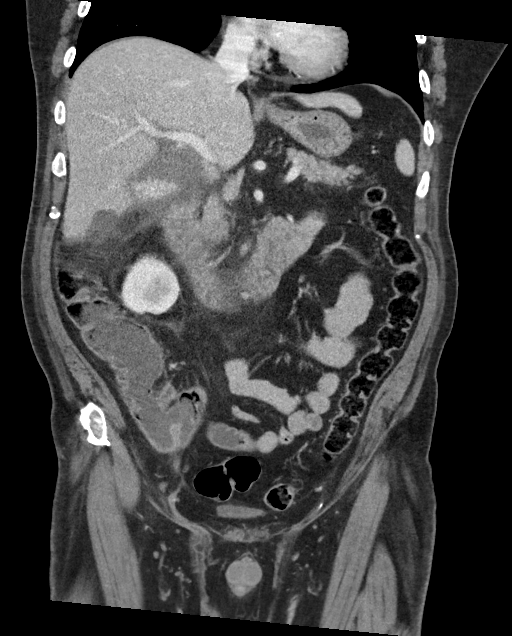
[im 59/107  soft-tissue]
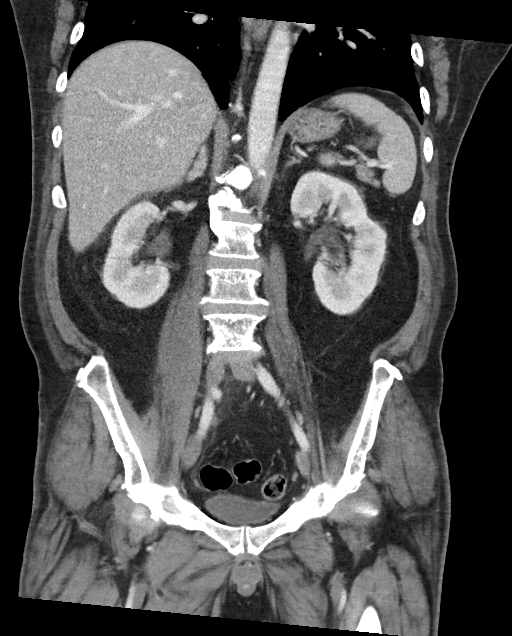

[15 of 46 positions shown; findings below may reference images not displayed]

FINDINGS: Lower chest: Normal.

Hepatobiliary: There is marked distention of the gallbladder with
soft tissue stranding around the gallbladder with a small amount of
pericholecystic fluid. Numerous tiny stones in the gallbladder.
There is a stone in the cystic duct likely obstructing the duct. No
dilated bile ducts. There are 2 loculated areas of fluid along the
inferior margin of the liver adjacent a to the gallbladder. These
collections appear to be subcapsular and are felt to be related to
the adjacent cholecystitis.

Pancreas: Unremarkable. No pancreatic ductal dilatation or
surrounding inflammatory changes.

Spleen: Normal in size without focal abnormality.

Adrenals/Urinary Tract: Adrenal glands are unremarkable. Kidneys are
normal, without renal calculi, focal lesion, or hydronephrosis.
Bladder is unremarkable.

Stomach/Bowel: Stomach is within normal limits. Appendix appears
normal. No evidence of bowel wall thickening, distention, or
inflammatory changes.

Vascular/Lymphatic: No significant vascular findings are present. No
enlarged abdominal or pelvic lymph nodes.

Reproductive: Prostate is unremarkable.

Other: No abdominal wall hernia or abnormality. No abdominopelvic
ascites.

Musculoskeletal: No acute abnormalities. Chronic deformity of the
left femoral head and neck with secondary deformity of the
acetabulum, likely representing congenital hip dysplasia and
secondary arthritis.
IMPRESSION: 1. Acute cholecystitis with a stone in the cystic duct likely
obstructing the duct.
2. Two loculated areas of fluid along the inferior margin of the
liver adjacent to the gallbladder. These are felt to be related to
the adjacent cholecystitis.
3. Chronic deformity of the left femoral head and neck with
secondary deformity of the acetabulum, likely representing
congenital hip dysplasia and secondary arthritis.

## 2022-01-01 ENCOUNTER — Other Ambulatory Visit: Payer: Self-pay | Admitting: Family Medicine

## 2022-01-01 MED ORDER — TIZANIDINE HCL 4 MG PO TABS: 4.0000 mg | ORAL_TABLET | Freq: Three times a day (TID) | ORAL | 0 refills | Status: DC | PRN

## 2022-01-01 NOTE — Telephone Encounter (Signed)
?  Prescription Request ? ?01/01/2022 ? ?Is this a "Controlled Substance" medicine? Not sure ? ?Have you seen your PCP in the last 2 weeks? no ? ?If YES, route message to pool  -  If NO, patient needs to be scheduled for appointment. ? ?What is the name of the medication or equipment? Tivanidine ? ?Have you contacted your pharmacy to request a refill? yes  ? ?Which pharmacy would you like this sent to? [facility] ? ? ?Patient notified that their request is being sent to the clinical staff for review and that they should receive a response within 2 business days.  ?  ?[facility] pt. ? ?Please call pt. ?

## 2022-01-02 NOTE — Telephone Encounter (Signed)
Pt aware refill sent to pharmacy 

## 2022-01-03 ENCOUNTER — Encounter: Payer: Self-pay | Admitting: *Deleted

## 2022-01-16 ENCOUNTER — Ambulatory Visit: Payer: Medicare Other | Admitting: Family Medicine

## 2022-03-01 ENCOUNTER — Encounter: Payer: Self-pay | Admitting: Family Medicine

## 2022-03-01 ENCOUNTER — Ambulatory Visit (INDEPENDENT_AMBULATORY_CARE_PROVIDER_SITE_OTHER): Payer: Medicare Other | Admitting: Family Medicine

## 2022-03-01 VITALS — BP 126/64 | HR 80 | Temp 97.9°F | Ht 67.0 in | Wt 207.2 lb

## 2022-03-01 DIAGNOSIS — E785 Hyperlipidemia, unspecified: Secondary | ICD-10-CM | POA: Diagnosis not present

## 2022-03-01 DIAGNOSIS — I152 Hypertension secondary to endocrine disorders: Secondary | ICD-10-CM

## 2022-03-01 DIAGNOSIS — F339 Major depressive disorder, recurrent, unspecified: Secondary | ICD-10-CM

## 2022-03-01 DIAGNOSIS — E1169 Type 2 diabetes mellitus with other specified complication: Secondary | ICD-10-CM | POA: Diagnosis not present

## 2022-03-01 DIAGNOSIS — E1159 Type 2 diabetes mellitus with other circulatory complications: Secondary | ICD-10-CM | POA: Diagnosis not present

## 2022-03-01 DIAGNOSIS — Z23 Encounter for immunization: Secondary | ICD-10-CM

## 2022-03-01 DIAGNOSIS — K219 Gastro-esophageal reflux disease without esophagitis: Secondary | ICD-10-CM | POA: Diagnosis not present

## 2022-03-01 LAB — CBC WITH DIFFERENTIAL/PLATELET
Basophils Absolute: 0.1 10*3/uL (ref 0.0–0.2)
Basos: 1 %
EOS (ABSOLUTE): 0.2 10*3/uL (ref 0.0–0.4)
Eos: 2 %
Hematocrit: 45 % (ref 37.5–51.0)
Hemoglobin: 15 g/dL (ref 13.0–17.7)
Immature Grans (Abs): 0 10*3/uL (ref 0.0–0.1)
Immature Granulocytes: 0 %
Lymphocytes Absolute: 2.1 10*3/uL (ref 0.7–3.1)
Lymphs: 25 %
MCH: 29.7 pg (ref 26.6–33.0)
MCHC: 33.3 g/dL (ref 31.5–35.7)
MCV: 89 fL (ref 79–97)
Monocytes Absolute: 0.7 10*3/uL (ref 0.1–0.9)
Monocytes: 8 %
Neutrophils Absolute: 5.3 10*3/uL (ref 1.4–7.0)
Neutrophils: 64 %
Platelets: 249 10*3/uL (ref 150–450)
RBC: 5.05 x10E6/uL (ref 4.14–5.80)
RDW: 13 % (ref 11.6–15.4)
WBC: 8.4 10*3/uL (ref 3.4–10.8)

## 2022-03-01 LAB — CMP14+EGFR
ALT: 33 IU/L (ref 0–44)
AST: 30 IU/L (ref 0–40)
Albumin/Globulin Ratio: 1.4 (ref 1.2–2.2)
Albumin: 4.6 g/dL (ref 3.8–4.9)
Alkaline Phosphatase: 48 IU/L (ref 44–121)
BUN/Creatinine Ratio: 10 (ref 9–20)
BUN: 8 mg/dL (ref 6–24)
Bilirubin Total: 0.2 mg/dL (ref 0.0–1.2)
CO2: 24 mmol/L (ref 20–29)
Calcium: 9.6 mg/dL (ref 8.7–10.2)
Chloride: 99 mmol/L (ref 96–106)
Creatinine, Ser: 0.79 mg/dL (ref 0.76–1.27)
Globulin, Total: 3.3 g/dL (ref 1.5–4.5)
Glucose: 78 mg/dL (ref 70–99)
Potassium: 4.7 mmol/L (ref 3.5–5.2)
Sodium: 137 mmol/L (ref 134–144)
Total Protein: 7.9 g/dL (ref 6.0–8.5)
eGFR: 104 mL/min/{1.73_m2} (ref >59)

## 2022-03-01 LAB — BAYER DCA HB A1C WAIVED: HB A1C (BAYER DCA - WAIVED): 6.4 % — ABNORMAL HIGH (ref 4.8–5.6)

## 2022-03-01 LAB — LIPID PANEL
Chol/HDL Ratio: 3.7 ratio (ref 0.0–5.0)
Cholesterol, Total: 119 mg/dL (ref 100–199)
HDL: 32 mg/dL — ABNORMAL LOW (ref >39)
LDL Chol Calc (NIH): 68 mg/dL (ref 0–99)
Triglycerides: 97 mg/dL (ref 0–149)
VLDL Cholesterol Cal: 19 mg/dL (ref 5–40)

## 2022-03-01 MED ORDER — SIMVASTATIN 40 MG PO TABS: 40.0000 mg | ORAL_TABLET | Freq: Every day | ORAL | 3 refills | Status: DC

## 2022-03-01 NOTE — Progress Notes (Signed)
? ?Established Patient Office Visit ? ?Subjective   ?Patient ID: Dale Terrell, male    DOB: Sep 24, 1966  Age: 56 y.o. MRN: [id number] ? ?Chief Complaint  ?Patient presents with  ? Medical Management of Chronic Issues  ? Diabetes  ? Hyperlipidemia  ? Hypertension  ? ? ?HPI ? ?DM ?Patient denies foot ulcerations, increased appetite, nausea, paresthesia of the feet, polydipsia, polyuria, visual disturbances, vomiting, and weight loss. ? ?Current diabetic medications include metformin, glimepiride ?Compliant with meds - Yes ? ?Current monitoring regimen: home blood tests - 2 times daily ?Home blood sugar records:  130-140 ?Any episodes of hypoglycemia? no ? ?Eye exam current (within one year): yes ?Weight trend: stable ?Current diet: in general, a "healthy" diet   ?Current exercise: none ? ?Is He on ACE inhibitor or angiotensin II receptor blocker?  ?Yes, lisinopril ?Is He on statin? Yes simvastatin ?Is He on ASA 81 mg daily?  Yes ? ?2. HTN ?Complaint with meds - Yes ?Current Medications - lisinopril ?Pertinent ROS:  ?Headache - No ?Fatigue - No ?Visual Disturbances - No ?Chest pain - No ?Dyspnea - No ?Palpitations - No ?LE edema - No ? ?3. Depression/anxiety  ? ? ?  03/01/2022  ? 10:12 AM 11/27/2021  ? 10:17 AM 11/23/2021  ? 11:58 AM  ?Depression screen PHQ 2/9  ?Decreased Interest 0 0 0  ?Down, Depressed, Hopeless 0 0 2  ?PHQ - 2 Score 0 0 2  ?Altered sleeping 0  1  ?Tired, decreased energy 0  1  ?Change in appetite 0  0  ?Feeling bad or failure about yourself  0  0  ?Trouble concentrating 0  1  ?Moving slowly or fidgety/restless 0  0  ?Suicidal thoughts 0  0  ?PHQ-9 Score 0  5  ?Difficult doing work/chores Not difficult at all  Somewhat difficult  ? ? ?  03/01/2022  ? 10:12 AM 09/24/2021  ?  3:31 PM  ?GAD 7 : Generalized Anxiety Score  ?Nervous, Anxious, on Edge 0 1  ?Control/stop worrying 0 0  ?Worry too much - different things 0 0  ?Trouble relaxing 0 0  ?Restless 0 0  ?Easily annoyed or irritable 0 0  ?Afraid - awful  might happen 0 0  ?Total GAD 7 Score 0 1  ?Anxiety Difficulty Not difficult at all Not difficult at all  ? ? ? ?ROS ?As per HPI.  ?  ?Objective:  ?  ? ?BP 126/64   Pulse 80   Temp 97.9 ?F (36.6 ?C) (Temporal)   Ht _0  (1.702 m)   Wt 207 lb 4 oz (94 kg)   BMI 32.46 kg/m?  ?BP Readings from Last 3 Encounters:  ?03/01/22 126/64  ?11/27/21 127/69  ?09/24/21 115/65  ? ?  ? ?Physical Exam ?Vitals and nursing note reviewed.  ?Constitutional:   ?   General: He is not in acute distress. ?   Appearance: He is not ill-appearing, toxic-appearing or diaphoretic.  ?HENT:  ?   Nose: Nose normal.  ?   Mouth/Throat:  ?   Mouth: Mucous membranes are moist.  ?   Pharynx: Oropharynx is clear.  ?Eyes:  ?   Extraocular Movements: Extraocular movements intact.  ?   Pupils: Pupils are equal, round, and reactive to light.  ?Cardiovascular:  ?   Rate and Rhythm: Normal rate and regular rhythm.  ?   Heart sounds: Normal heart sounds. No murmur heard. ?Pulmonary:  ?   Effort: Pulmonary effort is normal. No respiratory  distress.  ?   Breath sounds: Normal breath sounds.  ?Abdominal:  ?   General: Bowel sounds are normal. There is no distension.  ?   Palpations: Abdomen is soft.  ?   Tenderness: There is no abdominal tenderness. There is no guarding or rebound.  ?Musculoskeletal:  ?   Right lower leg: No edema.  ?   Left lower leg: No edema.  ?Skin: ?   General: Skin is warm and dry.  ?Neurological:  ?   General: No focal deficit present.  ?   Mental Status: He is alert and oriented to person, place, and time.  ?Psychiatric:     ?   Mood and Affect: Mood normal.     ?   Behavior: Behavior normal.     ?   Thought Content: Thought content normal.     ?   Judgment: Judgment normal.  ? ? ? ?No results found for any visits on 03/01/22. ? ?Last CBC ?Lab Results  ?Component Value Date  ? WBC 8.0 11/27/2021  ? HGB 14.8 11/27/2021  ? HCT 43.0 11/27/2021  ? MCV 87 11/27/2021  ? MCH 29.8 11/27/2021  ? RDW 12.8 11/27/2021  ? PLT 252 11/27/2021   ? ?Last metabolic panel ?Lab Results  ?Component Value Date  ? GLUCOSE 119 (H) 11/27/2021  ? NA 139 11/27/2021  ? K 5.2 11/27/2021  ? CL 99 11/27/2021  ? CO2 23 11/27/2021  ? BUN 10 11/27/2021  ? CREATININE 0.94 11/27/2021  ? EGFR 95 11/27/2021  ? CALCIUM 9.8 11/27/2021  ? PHOS 2.7 11/10/2019  ? PROT 7.8 11/27/2021  ? ALBUMIN 4.9 11/27/2021  ? LABGLOB 2.9 11/27/2021  ? AGRATIO 1.7 11/27/2021  ? BILITOT 0.3 11/27/2021  ? ALKPHOS 47 11/27/2021  ? AST 39 11/27/2021  ? ALT 37 11/27/2021  ? ANIONGAP 8 11/10/2019  ? ?Last lipids ?Lab Results  ?Component Value Date  ? CHOL 134 11/27/2021  ? HDL 32 (L) 11/27/2021  ? [place] 82 11/27/2021  ? TRIG 109 11/27/2021  ? CHOLHDL 4.2 11/27/2021  ? ?Last hemoglobin A1c ?Lab Results  ?Component Value Date  ? HGBA1C 6.6 (H) 11/27/2021  ? ?  ? ?The 10-year ASCVD risk score (Arnett DK, et al., 2019) is: 12.2% ? ?  ?Assessment & Plan:  ? ?Dale Terrell was seen today for medical management of chronic issues, diabetes, hyperlipidemia and hypertension. ? ?Diagnoses and all orders for this visit: ? ?Type 2 diabetes mellitus with hyperlipidemia ([place]) ?Well controlled. A1c 6.4 today. Continue current regimen. Foot and eye exam UTD. On statin, ACE, and aspirin. Labs pending.  ?-     Lipid panel ?-     Bayer DCA Hb A1c Waived ?-     Microalbumin / creatinine urine ratio ? ?Hypertension associated with diabetes ([place]) ?Well controlled on current regimen.  ?-     CBC with Differential/Platelet ?-     CMP14+EGFR ? ?Hyperlipidemia associated with type 2 diabetes mellitus ([place]) ?Labs pending.  ?-     simvastatin (ZOCOR) 40 MG tablet; Take 1 tablet (40 mg total) by mouth at bedtime. ? ?Depression, recurrent ([place]) ?Gastroesophageal reflux disease, unspecified whether esophagitis present ?Well controlled on current regimen.  ? ?Need for shingles vaccine ?Shingles vaccine today in office.  ? ? ?Return in about 6 months (around 09/01/2022) for chronic follow up.  ? ?The patient indicates understanding of these  issues and agrees with the plan. ? ?Gwenlyn Perking, FNP ? ?

## 2022-03-01 NOTE — Patient Instructions (Signed)
Food Choices for Gastroesophageal Reflux Disease, Adult When you have gastroesophageal reflux disease (GERD), the foods you eat and your eating habits are very important. Choosing the right foods can help ease the discomfort of GERD. Consider working with a dietitian to help you make healthy food choices. What are tips for following this plan? Reading food labels Look for foods that are low in saturated fat. Foods that have less than 5% of daily value (DV) of fat and 0 g of trans fats may help with your symptoms. Cooking Cook foods using methods other than frying. This may include baking, steaming, grilling, or broiling. These are all methods that do not need a lot of fat for cooking. To add flavor, try to use herbs that are low in spice and acidity. Meal planning  Choose healthy foods that are low in fat, such as fruits, vegetables, whole grains, low-fat dairy products, lean meats, fish, and poultry. Eat frequent, small meals instead of three large meals each day. Eat your meals slowly, in a relaxed setting. Avoid bending over or lying down until 2-3 hours after eating. Limit high-fat foods such as fatty meats or fried foods. Limit your intake of fatty foods, such as oils, butter, and shortening. Avoid the following as told by your health care provider: Foods that cause symptoms. These may be different for different people. Keep a food diary to keep track of foods that cause symptoms. Alcohol. Drinking large amounts of liquid with meals. Eating meals during the 2-3 hours before bed. Lifestyle Maintain a healthy weight. Ask your health care provider what weight is healthy for you. If you need to lose weight, work with your health care provider to do so safely. Exercise for at least 30 minutes on 5 or more days each week, or as told by your health care provider. Avoid wearing clothes that fit tightly around your waist and chest. Do not use any products that contain nicotine or tobacco. These  products include cigarettes, chewing tobacco, and vaping devices, such as e-cigarettes. If you need help quitting, ask your health care provider. Sleep with the head of your bed raised. Use a wedge under the mattress or blocks under the bed frame to raise the head of the bed. Chew sugar-free gum after mealtimes. What foods should I eat?  Eat a healthy, well-balanced diet of fruits, vegetables, whole grains, low-fat dairy products, lean meats, fish, and poultry. Each person is different. Foods that may trigger symptoms in one person may not trigger any symptoms in another person. Work with your health care provider to identify foods that are safe for you. The items listed above may not be a complete list of recommended foods and beverages. Contact a dietitian for more information. What foods should I avoid? Limiting some of these foods may help manage the symptoms of GERD. Everyone is different. Consult a dietitian or your health care provider to help you identify the exact foods to avoid, if any. Fruits Any fruits prepared with added fat. Any fruits that cause symptoms. For some people this may include citrus fruits, such as oranges, grapefruit, pineapple, and lemons. Vegetables Deep-fried vegetables. French fries. Any vegetables prepared with added fat. Any vegetables that cause symptoms. For some people, this may include tomatoes and tomato products, chili peppers, onions and garlic, and horseradish. Grains Pastries or quick breads with added fat. Meats and other proteins High-fat meats, such as fatty beef or pork, hot dogs, ribs, ham, sausage, salami, and bacon. Fried meat or protein, including   fried fish and fried chicken. Nuts and nut butters, in large amounts. Dairy Whole milk and chocolate milk. Sour cream. Cream. Ice cream. Cream cheese. Milkshakes. Fats and oils Butter. Margarine. Shortening. Ghee. Beverages Coffee and tea, with or without caffeine. Carbonated beverages. Sodas. Energy  drinks. Fruit juice made with acidic fruits, such as orange or grapefruit. Tomato juice. Alcoholic drinks. Sweets and desserts Chocolate and cocoa. Donuts. Seasonings and condiments Pepper. Peppermint and spearmint. Added salt. Any condiments, herbs, or seasonings that cause symptoms. For some people, this may include curry, hot sauce, or vinegar-based salad dressings. The items listed above may not be a complete list of foods and beverages to avoid. Contact a dietitian for more information. Questions to ask your health care provider Diet and lifestyle changes are usually the first steps that are taken to manage symptoms of GERD. If diet and lifestyle changes do not improve your symptoms, talk with your health care provider about taking medicines. Where to find more information [employer]: aboutgerd.org Summary When you have gastroesophageal reflux disease (GERD), food and lifestyle choices may be very helpful in easing the discomfort of GERD. Eat frequent, small meals instead of three large meals each day. Eat your meals slowly, in a relaxed setting. Avoid bending over or lying down until 2-3 hours after eating. Limit high-fat foods such as fatty meats or fried foods. This information is not intended to replace advice given to you by your health care provider. Make sure you discuss any questions you have with your health care provider. Document Revised: 04/24/2020 Document Reviewed: 04/24/2020 Elsevier Patient Education  2023 Elsevier Inc.  

## 2022-03-02 LAB — MICROALBUMIN / CREATININE URINE RATIO
Creatinine, Urine: 121.6 mg/dL
Microalb/Creat Ratio: 10 mg/g creat (ref 0–29)
Microalbumin, Urine: 11.8 ug/mL

## 2022-03-04 DIAGNOSIS — E119 Type 2 diabetes mellitus without complications: Secondary | ICD-10-CM | POA: Diagnosis not present

## 2022-03-04 LAB — HM DIABETES EYE EXAM

## 2022-04-02 ENCOUNTER — Other Ambulatory Visit: Payer: Self-pay | Admitting: Nurse Practitioner

## 2022-04-04 DIAGNOSIS — M79671 Pain in right foot: Secondary | ICD-10-CM | POA: Diagnosis not present

## 2022-04-04 DIAGNOSIS — M79674 Pain in right toe(s): Secondary | ICD-10-CM | POA: Diagnosis not present

## 2022-04-04 DIAGNOSIS — M79675 Pain in left toe(s): Secondary | ICD-10-CM | POA: Diagnosis not present

## 2022-04-04 DIAGNOSIS — M79672 Pain in left foot: Secondary | ICD-10-CM | POA: Diagnosis not present

## 2022-04-04 DIAGNOSIS — Q828 Other specified congenital malformations of skin: Secondary | ICD-10-CM | POA: Diagnosis not present

## 2022-04-04 DIAGNOSIS — E1140 Type 2 diabetes mellitus with diabetic neuropathy, unspecified: Secondary | ICD-10-CM | POA: Diagnosis not present

## 2022-05-18 ENCOUNTER — Other Ambulatory Visit: Payer: Self-pay | Admitting: Family Medicine

## 2022-05-20 ENCOUNTER — Other Ambulatory Visit: Payer: Self-pay | Admitting: Family Medicine

## 2022-05-20 ENCOUNTER — Telehealth: Payer: Self-pay | Admitting: Family Medicine

## 2022-05-20 NOTE — Telephone Encounter (Signed)
Last office visit 03/01/22 Medication is on med list but does not show prescribed from here, per protocol, I have to send to you for approval.

## 2022-05-20 NOTE — Telephone Encounter (Signed)
  Prescription Request  05/20/2022  Is this a "Controlled Substance" medicine? no  Have you seen your PCP in the last 2 weeks? Sppt 11/6  If YES, route message to pool  -  If NO, patient needs to be scheduled for appointment.  What is the name of the medication or equipment? metFORMIN (GLUCOPHAGE) 1000 MG tablet  Have you contacted your pharmacy to request a refill? Yes    Which pharmacy would you like this sent to? [facility], [address]   Patient notified that their request is being sent to the clinical staff for review and that they should receive a response within 2 business days.

## 2022-05-20 NOTE — Telephone Encounter (Signed)
RF request came in electronically, wait for response then call pt

## 2022-05-21 NOTE — Telephone Encounter (Signed)
Pt aware refill sent to pharmacy 

## 2022-06-24 ENCOUNTER — Other Ambulatory Visit: Payer: Self-pay | Admitting: Family Medicine

## 2022-06-24 ENCOUNTER — Encounter: Payer: Self-pay | Admitting: Family Medicine

## 2022-06-24 ENCOUNTER — Ambulatory Visit (INDEPENDENT_AMBULATORY_CARE_PROVIDER_SITE_OTHER): Payer: Medicare Other | Admitting: Family Medicine

## 2022-06-24 VITALS — BP 121/66 | HR 98 | Temp 97.8°F | Ht 67.0 in | Wt 201.2 lb

## 2022-06-24 DIAGNOSIS — Z20822 Contact with and (suspected) exposure to covid-19: Secondary | ICD-10-CM | POA: Diagnosis not present

## 2022-06-24 MED ORDER — LISINOPRIL 5 MG PO TABS: 5.0000 mg | ORAL_TABLET | Freq: Every day | ORAL | 1 refills | Status: DC

## 2022-06-24 MED ORDER — HYDROCODONE BIT-HOMATROP MBR 5-1.5 MG/5ML PO SOLN: 5.0000 mL | Freq: Three times a day (TID) | ORAL | 0 refills | Status: DC | PRN

## 2022-06-24 MED ORDER — MOLNUPIRAVIR EUA 200MG CAPSULE: 4.0000 | ORAL_CAPSULE | Freq: Two times a day (BID) | ORAL | 0 refills | Status: AC

## 2022-06-24 NOTE — Telephone Encounter (Signed)
Pt called stating that he was told from the nurse to call back and let PCP know which medicine he needed refilled. Pt said its his LISINOPRIL that he needs refilled.   Pt uses [facility] in [place].

## 2022-06-24 NOTE — Telephone Encounter (Signed)
Please advise, patient needs refill on Lisinopril.  Medication is on med list but not prescribed here before so must be approved by you per protocol.

## 2022-06-24 NOTE — Telephone Encounter (Signed)
Per verbal ok from Marjorie Smolder, lisinopril sent into pharmacy and pt is aware.

## 2022-06-24 NOTE — Progress Notes (Signed)
Acute Office Visit  Subjective:     Patient ID: Dale Terrell, male    DOB: 1966/02/20, 56 y.o.   MRN: [id number]  Chief Complaint  Patient presents with   Cough    URI  This is a new problem. The current episode started in the past 7 days (4 days ago). The problem has been unchanged. There has been no fever. Associated symptoms include congestion, coughing, headaches, nausea, sneezing, a sore throat and vomiting. Pertinent negatives include no abdominal pain, chest pain, diarrhea, dysuria, ear pain, joint pain, joint swelling, neck pain, plugged ear sensation, rash, rhinorrhea, sinus pain, swollen glands or wheezing. He has tried acetaminophen, decongestant, increased fluids and eating for the symptoms. The treatment provided mild relief.     Review of Systems  Constitutional:  Positive for chills and malaise/fatigue. Negative for diaphoresis and fever.  HENT:  Positive for congestion, sneezing and sore throat. Negative for ear pain, rhinorrhea and sinus pain.   Respiratory:  Positive for cough and sputum production. Negative for hemoptysis, shortness of breath, wheezing and stridor.   Cardiovascular:  Negative for chest pain and leg swelling.  Gastrointestinal:  Positive for nausea and vomiting. Negative for abdominal pain and diarrhea.  Genitourinary:  Negative for dysuria.  Musculoskeletal:  Negative for joint pain and neck pain.  Skin:  Negative for rash.  Neurological:  Positive for headaches.        Objective:    BP 121/66   Pulse 98   Temp 97.8 F (36.6 C) (Temporal)   Ht '5\' 7"'$  (1.702 m)   Wt 201 lb 4 oz (91.3 kg)   SpO2 98%   BMI 31.52 kg/m    Physical Exam Vitals and nursing note reviewed.  Constitutional:      General: He is not in acute distress.    Appearance: He is not ill-appearing, toxic-appearing or diaphoretic.  HENT:     Head: Normocephalic and atraumatic.     Right Ear: Tympanic membrane, ear canal and external ear normal.     Left Ear:  Tympanic membrane, ear canal and external ear normal.     Nose: Congestion present.     Mouth/Throat:     Mouth: Mucous membranes are moist.     Pharynx: Oropharynx is clear. Posterior oropharyngeal erythema present. No oropharyngeal exudate.  Eyes:     General:        Right eye: No discharge.        Left eye: No discharge.     Pupils: Pupils are equal, round, and reactive to light.  Cardiovascular:     Rate and Rhythm: Normal rate and regular rhythm.     Heart sounds: Normal heart sounds. No murmur heard. Pulmonary:     Effort: Pulmonary effort is normal. No respiratory distress.     Breath sounds: Normal breath sounds. No wheezing or rhonchi.  Abdominal:     General: Bowel sounds are normal. There is no distension.     Palpations: Abdomen is soft.     Tenderness: There is no abdominal tenderness. There is no guarding or rebound.  Skin:    General: Skin is warm and dry.  Neurological:     General: No focal deficit present.     Mental Status: He is alert and oriented to person, place, and time.  Psychiatric:        Mood and Affect: Mood normal.        Behavior: Behavior normal.     No results found  for any visits on 06/24/22.      Assessment & Plan:   Glenden was seen today for cough.  Diagnoses and all orders for this visit:  Suspected COVID-19 virus infection Symptoms x 4 days. Will start antiviral pending test results as patient has DM and HTN. Discussed symptomatic care and return precautions. Discussed quarantine recommendations.  -     molnupiravir EUA (LAGEVRIO) 200 mg CAPS capsule; Take 4 capsules (800 mg total) by mouth 2 (two) times daily for 5 days. -     HYDROcodone bit-homatropine (HYCODAN) 5-1.5 MG/5ML syrup; Take 5 mLs by mouth every 8 (eight) hours as needed for cough. -     Novel Coronavirus, NAA ([facility])  The patient indicates understanding of these issues and agrees with the plan.  Gwenlyn Perking, FNP

## 2022-06-25 LAB — NOVEL CORONAVIRUS, NAA: SARS-CoV-2, NAA: DETECTED — AB

## 2022-07-02 ENCOUNTER — Encounter (HOSPITAL_COMMUNITY): Payer: Self-pay

## 2022-07-02 ENCOUNTER — Other Ambulatory Visit: Payer: Self-pay

## 2022-07-02 ENCOUNTER — Emergency Department (HOSPITAL_COMMUNITY): Payer: Medicare Other

## 2022-07-02 ENCOUNTER — Emergency Department (HOSPITAL_COMMUNITY)
Admission: EM | Admit: 2022-07-02 | Discharge: 2022-07-02 | Disposition: A | Discharge status: Home / Self Care | Payer: Medicare Other | Attending: Emergency Medicine | Admitting: Emergency Medicine

## 2022-07-02 DIAGNOSIS — E119 Type 2 diabetes mellitus without complications: Secondary | ICD-10-CM | POA: Diagnosis not present

## 2022-07-02 DIAGNOSIS — U071 COVID-19: Secondary | ICD-10-CM | POA: Diagnosis not present

## 2022-07-02 DIAGNOSIS — R0602 Shortness of breath: Secondary | ICD-10-CM | POA: Diagnosis present

## 2022-07-02 DIAGNOSIS — R110 Nausea: Secondary | ICD-10-CM | POA: Diagnosis not present

## 2022-07-02 DIAGNOSIS — R052 Subacute cough: Secondary | ICD-10-CM

## 2022-07-02 DIAGNOSIS — Z79899 Other long term (current) drug therapy: Secondary | ICD-10-CM | POA: Insufficient documentation

## 2022-07-02 DIAGNOSIS — R079 Chest pain, unspecified: Secondary | ICD-10-CM | POA: Diagnosis not present

## 2022-07-02 DIAGNOSIS — R0789 Other chest pain: Secondary | ICD-10-CM | POA: Diagnosis not present

## 2022-07-02 DIAGNOSIS — Z7984 Long term (current) use of oral hypoglycemic drugs: Secondary | ICD-10-CM | POA: Diagnosis not present

## 2022-07-02 DIAGNOSIS — Z7982 Long term (current) use of aspirin: Secondary | ICD-10-CM | POA: Diagnosis not present

## 2022-07-02 DIAGNOSIS — R1111 Vomiting without nausea: Secondary | ICD-10-CM | POA: Diagnosis not present

## 2022-07-02 DIAGNOSIS — R059 Cough, unspecified: Secondary | ICD-10-CM | POA: Diagnosis not present

## 2022-07-02 DIAGNOSIS — I10 Essential (primary) hypertension: Secondary | ICD-10-CM | POA: Diagnosis not present

## 2022-07-02 DIAGNOSIS — Z743 Need for continuous supervision: Secondary | ICD-10-CM | POA: Diagnosis not present

## 2022-07-02 DIAGNOSIS — R197 Diarrhea, unspecified: Secondary | ICD-10-CM | POA: Diagnosis not present

## 2022-07-02 LAB — COMPREHENSIVE METABOLIC PANEL
ALT: 50 U/L — ABNORMAL HIGH (ref 0–44)
AST: 40 U/L (ref 15–41)
Albumin: 4.2 g/dL (ref 3.5–5.0)
Alkaline Phosphatase: 38 U/L (ref 38–126)
Anion gap: 6 (ref 5–15)
BUN: 11 mg/dL (ref 6–20)
CO2: 29 mmol/L (ref 22–32)
Calcium: 9.8 mg/dL (ref 8.9–10.3)
Chloride: 103 mmol/L (ref 98–111)
Creatinine, Ser: 0.72 mg/dL (ref 0.61–1.24)
GFR, Estimated: >60 mL/min (ref >60)
Glucose, Bld: 177 mg/dL — ABNORMAL HIGH (ref 70–99)
Potassium: 4.9 mmol/L (ref 3.5–5.1)
Sodium: 138 mmol/L (ref 135–145)
Total Bilirubin: 0.4 mg/dL (ref 0.3–1.2)
Total Protein: 8.1 g/dL (ref 6.5–8.1)

## 2022-07-02 LAB — CBC
HCT: 45.3 % (ref 39.0–52.0)
Hemoglobin: 14.4 g/dL (ref 13.0–17.0)
MCH: 29.5 pg (ref 26.0–34.0)
MCHC: 31.8 g/dL (ref 30.0–36.0)
MCV: 92.8 fL (ref 80.0–100.0)
Platelets: 253 10*3/uL (ref 150–400)
RBC: 4.88 MIL/uL (ref 4.22–5.81)
RDW: 13 % (ref 11.5–15.5)
WBC: 8.8 10*3/uL (ref 4.0–10.5)
nRBC: 0 % (ref 0.0–0.2)

## 2022-07-02 LAB — TROPONIN I (HIGH SENSITIVITY)
Troponin I (High Sensitivity): <2 ng/L (ref <18)
Troponin I (High Sensitivity): <2 ng/L (ref <18)

## 2022-07-02 MED ORDER — BENZONATATE 100 MG PO CAPS
100.0000 mg | ORAL_CAPSULE | Freq: Once | ORAL | Status: AC
  Administered 2022-07-02: 100 mg via ORAL
  Filled 2022-07-02: qty 1

## 2022-07-02 MED ORDER — BENZONATATE 100 MG PO CAPS: 100.0000 mg | ORAL_CAPSULE | Freq: Three times a day (TID) | ORAL | 0 refills | Status: DC

## 2022-07-02 NOTE — Discharge Instructions (Signed)
Note your work-up today was overall reassuring.  No signs of heart attack. Your symptoms are most likely secondary to excessive coughing during episode experienced earlier.  I will prescribe cough suppressant called benzonatate or Tessalon Perles to take as needed.  Recommend taking Advil as needed for pain.  Recommend close follow-up with your primary care provider in 2 to 3 days for reevaluation of your symptoms.  Please do not hesitate to return to the emergency department for worrisome signs and symptoms we discussed become apparent.

## 2022-07-02 NOTE — ED Provider Notes (Signed)
[facility] EMERGENCY DEPARTMENT Provider Note   CSN: [id number] Arrival date & time: 07/02/22  1717     History  Chief Complaint  Patient presents with   Shortness of Breath    Dale Terrell is a 56 y.o. male.   Shortness of Breath   56 year old male presents emergency department with complaints of shortness of breath, cough.  Patient states he had an episode when walking from taking out his trash earlier today of chest pain and shortness of breath.  He states that he was putting a new trash bag in the trash can when he "got choked up".  He felt shortness of breath and left-sided chest pain during episode.  Episode lasted for approximately 5 minutes before it resolved spontaneously.  He noted coughing incessantly during episode.  Patient states that he has had cough for approximately the past week to week and a half.  He tested positive for COVID on 06/24/2022 and has been seeing his primary care for symptomatic therapy.  He has been taking Robitussin with codeine since last Friday.  He states that cough has been persistent since diagnosis with COVID.  He denies any current nasal congestion, sore throat, fever, chills, night sweats, abdominal pain, nausea, vomiting, urinary symptoms, change in bowel habits  Past medical history significant for hypertension, hyperlipidemia, diabetes mellitus type 2,  Home Medications Prior to Admission medications   Medication Sig Start Date End Date Taking? Authorizing Provider  benzonatate (TESSALON) 100 MG capsule Take 1 capsule (100 mg total) by mouth every 8 (eight) hours. 07/02/22  Yes Dion Saucier A, PA  aspirin EC 81 MG tablet Take 81 mg by mouth daily.    [provider]  glimepiride (AMARYL) 4 MG tablet TAKE 2 TABLETS BY MOUTH 30 MINUTES BEFORE BREAKFAST DAILY. 05/20/22   Gwenlyn Perking, FNP  HYDROcodone bit-homatropine (HYCODAN) 5-1.5 MG/5ML syrup Take 5 mLs by mouth every 8 (eight) hours as needed for cough. 06/24/22   Gwenlyn Perking, FNP  lisinopril (ZESTRIL) 5 MG tablet Take 1 tablet (5 mg total) by mouth daily. 06/24/22   Gwenlyn Perking, FNP  metFORMIN (GLUCOPHAGE) 1000 MG tablet TAKE 1 TABLET BY MOUTH TWICE DAILY WITH MEALS. 05/20/22   Gwenlyn Perking, FNP  sertraline (ZOLOFT) 100 MG tablet Take 1 tablet by mouth daily. 03/14/21   [provider]  simvastatin (ZOCOR) 40 MG tablet Take 1 tablet (40 mg total) by mouth at bedtime. 03/01/22   Gwenlyn Perking, FNP  tiZANidine (ZANAFLEX) 4 MG tablet TAKE (1) TABLET BY MOUTH EVERY EIGHT HOURS AS NEEDED. 04/03/22   Gwenlyn Perking, FNP      Allergies    Bee venom and Ciprofloxacin    Review of Systems   Review of Systems  Respiratory:  Positive for shortness of breath.   All other systems reviewed and are negative.   Physical Exam Updated Vital Signs BP 126/80   Pulse 71   Temp 98 F (36.7 C) (Oral)   Resp 16   Ht '5\' 7"'$  (1.702 m)   Wt 93 kg   SpO2 100%   BMI 32.11 kg/m  Physical Exam Vitals and nursing note reviewed.  Constitutional:      General: He is not in acute distress.    Appearance: He is well-developed.  HENT:     Head: Normocephalic and atraumatic.  Eyes:     Conjunctiva/sclera: Conjunctivae normal.  Cardiovascular:     Rate and Rhythm: Normal rate and regular rhythm.  Heart sounds: No murmur heard. Pulmonary:     Effort: Pulmonary effort is normal. No respiratory distress.     Breath sounds: Normal breath sounds. No decreased breath sounds, wheezing, rhonchi or rales.  Chest:     Chest wall: No tenderness.  Abdominal:     Palpations: Abdomen is soft.     Tenderness: There is no abdominal tenderness.  Musculoskeletal:        General: No swelling.     Cervical back: Normal range of motion and neck supple.     Right lower leg: No edema.     Left lower leg: No edema.  Skin:    General: Skin is warm and dry.     Capillary Refill: Capillary refill takes less than 2 seconds.  Neurological:     Mental Status: He is  alert.  Psychiatric:        Mood and Affect: Mood normal.     ED Results / Procedures / Treatments   Labs (all labs ordered are listed, but only abnormal results are displayed) Labs Reviewed  COMPREHENSIVE METABOLIC PANEL - Abnormal; Notable for the following components:      Result Value   Glucose, Bld 177 (*)    ALT 50 (*)    All other components within normal limits  CBC  TROPONIN I (HIGH SENSITIVITY)  TROPONIN I (HIGH SENSITIVITY)    EKG None  Radiology DG Chest 2 View  Result Date: 07/02/2022 CLINICAL DATA:  Chest pain EXAM: CHEST - 2 VIEW COMPARISON:  01/24/2016 FINDINGS: Lungs are well expanded, symmetric, and clear. No pneumothorax or pleural effusion. Cardiac size within normal limits. Pulmonary vascularity is normal. Osseous structures are age-appropriate. No acute bone abnormality. IMPRESSION: No active cardiopulmonary disease. Electronically Signed   By: Fidela Salisbury M.D.   On: 07/02/2022 19:50    Procedures Procedures    Medications Ordered in ED Medications  benzonatate (TESSALON) capsule 100 mg (100 mg Oral Given 07/02/22 1929)    ED Course/ Medical Decision Making/ A&P                           Medical Decision Making Amount and/or Complexity of Data Reviewed Labs: ordered. Radiology: ordered.  Risk Prescription drug management.   This patient presents to the ED for concern of chest pain/shortness of breath, this involves an extensive number of treatment options, and is a complaint that carries with it a high risk of complications and morbidity.  The differential diagnosis includes ACS, pneumonia, pulmonary embolism, pneumothorax, aortic dissection, pericarditis/myocarditis, bronchitis,  Co morbidities that complicate the patient evaluation  See HPI   Additional history obtained:  Additional history obtained from EMR External records from outside source obtained and reviewed including prior EKG performed on 09/20/2013   Lab Tests:  I  Ordered, and personally interpreted labs.  The pertinent results include: No leukocytosis noted.  No evidence of anemia.  Platelets within normal range.  No electrolyte abnormality noted.  Renal function within normal limits.  No transaminitis noted.  Troponin less than 2 with delta negative.  No EKG findings indicative of ischemia.  Doubt ACS.   Imaging Studies ordered:  I ordered imaging studies including chest x-ray I independently visualized and interpreted imaging which showed no acute cardiopulmonary process I agree with the radiologist interpretation   Cardiac Monitoring: / EKG:  The patient was maintained on a cardiac monitor.  I personally viewed and interpreted the cardiac monitored which showed an underlying  rhythm of: Sinus rhythm with no evidence of ischemia   Consultations Obtained:  N/a chest pain/cough   Problem List / ED Course / Critical interventions / Medication management  Chest pain/cough I ordered medication including benzonatate for cough   Reevaluation of the patient after these medicines showed that the patient improved I have reviewed the patients home medicines and have made adjustments as needed   Social Determinants of Health:  Former cigarette use quit in 2021   Test / Admission - Considered:  Vitals signs within normal range and stable throughout visit. Laboratory/imaging studies significant for: See above Patient's symptoms likely secondary to coughing fit experienced as described in HPI.  Doubt ACS given delta negative troponin as well as lack of EKG findings indicative of ischemia.  Doubt pulmonary embolism given lack of history, lack of tachycardia, lack of lower extremity edema as well as chest pain as well as shortness of breath be confined to isolated episode.  Well score for pulmonary embolism 0.  Doubt pneumonia.  Doubt pneumothorax.  Doubt pericarditis/myocarditis.  Symptomatic therapy recommended with benzonatate Perles for cough  suppressant as well as NSAIDs as needed for pain.  Recommend close follow-up with PCP for reevaluation in 2 to 3 days.  Treatment plan discussed with patient he acknowledged understanding was agreeable to said plan. Worrisome signs and symptoms were discussed with the patient, and the patient acknowledged understanding to return to the ED if noticed. Patient was stable upon discharge.         Final Clinical Impression(s) / ED Diagnoses Final diagnoses:  Subacute cough  Chest pain, unspecified type    Rx / DC Orders ED Discharge Orders          Ordered    benzonatate (TESSALON) 100 MG capsule  Every 8 hours        07/02/22 2230              Wilnette Kales, [place] 07/02/22 2230    Tretha Sciara, MD 07/02/22 [phone]

## 2022-07-02 NOTE — ED Triage Notes (Signed)
Pt to er, pt states that he was putting a trash bag into the trash can and he got choked up and felt like he couldn't breath, states that he has had similar episodes in the past.  Pt states that he walked outside and felt better. Pt states that he is here to get checked out, pt resps are even and unlabored

## 2022-07-29 ENCOUNTER — Ambulatory Visit: Payer: Medicare Other | Admitting: Family Medicine

## 2022-08-05 ENCOUNTER — Other Ambulatory Visit: Payer: Self-pay | Admitting: Family Medicine

## 2022-08-07 ENCOUNTER — Other Ambulatory Visit: Payer: Self-pay | Admitting: Family Medicine

## 2022-08-19 ENCOUNTER — Other Ambulatory Visit: Payer: Self-pay | Admitting: Family Medicine

## 2022-09-02 ENCOUNTER — Ambulatory Visit: Payer: Medicare Other | Admitting: Family Medicine

## 2022-09-06 ENCOUNTER — Encounter: Payer: Self-pay | Admitting: Family Medicine

## 2022-09-06 ENCOUNTER — Ambulatory Visit (INDEPENDENT_AMBULATORY_CARE_PROVIDER_SITE_OTHER): Payer: Medicare Other | Admitting: Family Medicine

## 2022-09-06 VITALS — BP 114/69 | HR 86 | Temp 98.1°F | Ht 67.0 in | Wt 202.0 lb

## 2022-09-06 DIAGNOSIS — K219 Gastro-esophageal reflux disease without esophagitis: Secondary | ICD-10-CM | POA: Diagnosis not present

## 2022-09-06 DIAGNOSIS — E785 Hyperlipidemia, unspecified: Secondary | ICD-10-CM

## 2022-09-06 DIAGNOSIS — M79605 Pain in left leg: Secondary | ICD-10-CM | POA: Diagnosis not present

## 2022-09-06 DIAGNOSIS — E1169 Type 2 diabetes mellitus with other specified complication: Secondary | ICD-10-CM

## 2022-09-06 DIAGNOSIS — R079 Chest pain, unspecified: Secondary | ICD-10-CM | POA: Diagnosis not present

## 2022-09-06 DIAGNOSIS — Z23 Encounter for immunization: Secondary | ICD-10-CM | POA: Diagnosis not present

## 2022-09-06 DIAGNOSIS — I152 Hypertension secondary to endocrine disorders: Secondary | ICD-10-CM | POA: Diagnosis not present

## 2022-09-06 DIAGNOSIS — F339 Major depressive disorder, recurrent, unspecified: Secondary | ICD-10-CM

## 2022-09-06 DIAGNOSIS — E1159 Type 2 diabetes mellitus with other circulatory complications: Secondary | ICD-10-CM | POA: Diagnosis not present

## 2022-09-06 LAB — BAYER DCA HB A1C WAIVED: HB A1C (BAYER DCA - WAIVED): 7.3 % — ABNORMAL HIGH (ref 4.8–5.6)

## 2022-09-06 MED ORDER — EMPAGLIFLOZIN 10 MG PO TABS: 10.0000 mg | ORAL_TABLET | Freq: Every day | ORAL | 3 refills | Status: DC

## 2022-09-06 MED ORDER — FAMOTIDINE 20 MG PO TABS: 20.0000 mg | ORAL_TABLET | Freq: Two times a day (BID) | ORAL | 1 refills | Status: DC

## 2022-09-06 NOTE — Progress Notes (Signed)
Established Patient Office Visit  Subjective   Patient ID: Dale Terrell, male    DOB: 04/01/1966  Age: 56 y.o. MRN: [id number]  Chief Complaint  Patient presents with   Diabetes    6 month followup with diabetes    HPI  DM Patient denies foot ulcerations, increased appetite, nausea, paresthesia of the feet, polydipsia, polyuria, visual disturbances, vomiting, and weight loss.  Current diabetic medications include metformin, glimepiride Compliant with meds - Yes  Current monitoring regimen: home blood tests - 2 times daily Home blood sugar records:  130-140 Any episodes of hypoglycemia? no  Eye exam current (within one year): yes Weight trend: stable Current diet: in general, a "healthy" diet   Current exercise: none  Is He on ACE inhibitor or angiotensin II receptor blocker?  Yes, lisinopril Is He on statin? Yes simvastatin Is He on ASA 81 mg daily?  Yes  2. HTN Complaint with meds - Yes Current Medications - lisinopril Pertinent ROS:  Headache - No Fatigue - No Visual Disturbances - No Dyspnea - No Palpitations - No LE edema - No Chest pain - He reports occasional chest pain for about 10-15 minutes at night when he is laying down. It improves on its own. It is a sharp pain, usually mild. He denies any aggravating factors. He sometimes feel short of breath with this. This occurred last 2 night ago. He does sometimes have heartburn and he takes tums when he has the chest pain and this does help sometimes  3. Left leg pain He reports a achy pain in his left lower leg on the lateral side for months. It is intermittent. It improves with flexion of his leg and worsens with extension. He denies numbness, tingling, injury, erythema, warmth, tenderness, swelling, or edema. He has intermittent back pain. He takes muscle relaxer prn for this and this does help with his leg pain as well.    4. Depression/anxiety  He takes zoloft daily.      09/06/2022    9:30 AM  09/06/2022    9:29 AM 06/24/2022    1:41 PM  Depression screen PHQ 2/9  Decreased Interest  0 1  Down, Depressed, Hopeless  1 0  PHQ - 2 Score  1 1  Altered sleeping 0  0  Tired, decreased energy 1  1  Change in appetite 0  0  Feeling bad or failure about yourself  0  0  Trouble concentrating 0  0  Moving slowly or fidgety/restless 0  0  Suicidal thoughts 0  0  PHQ-9 Score   2  Difficult doing work/chores Not difficult at all  Not difficult at all      09/06/2022    9:30 AM 06/24/2022    1:41 PM 03/01/2022   10:12 AM 09/24/2021    3:31 PM  GAD 7 : Generalized Anxiety Score  Nervous, Anxious, on Edge 3 0 0 1  Control/stop worrying 0 0 0 0  Worry too much - different things 0 1 0 0  Trouble relaxing 0 0 0 0  Restless 0 0 0 0  Easily annoyed or irritable 0 0 0 0  Afraid - awful might happen 0 0 0 0  Total GAD 7 Score 3 1 0 1  Anxiety Difficulty Not difficult at all Not difficult at all Not difficult at all Not difficult at all     ROS As per HPI.    Objective:     BP 114/69  Pulse 86   Temp 98.1 F (36.7 C)   Ht _0  (1.702 m)   Wt 202 lb (91.6 kg)   SpO2 99%   BMI 31.64 kg/m  BP Readings from Last 3 Encounters:  09/06/22 114/69  07/02/22 126/80  06/24/22 121/66   Wt Readings from Last 3 Encounters:  09/06/22 202 lb (91.6 kg)  07/02/22 205 lb (93 kg)  06/24/22 201 lb 4 oz (91.3 kg)     Physical Exam Vitals and nursing note reviewed.  Constitutional:      General: He is not in acute distress.    Appearance: He is not ill-appearing, toxic-appearing or diaphoretic.  HENT:     Nose: Nose normal.     Mouth/Throat:     Mouth: Mucous membranes are moist.     Pharynx: Oropharynx is clear.  Eyes:     General: No scleral icterus.    Extraocular Movements: Extraocular movements intact.     Pupils: Pupils are equal, round, and reactive to light.  Neck:     Thyroid: No thyroid mass, thyromegaly or thyroid tenderness.  Cardiovascular:     Rate and Rhythm:  Normal rate and regular rhythm.     Heart sounds: Normal heart sounds. No murmur heard. Pulmonary:     Effort: Pulmonary effort is normal. No respiratory distress.     Breath sounds: Normal breath sounds.  Abdominal:     General: Bowel sounds are normal. There is no distension.     Palpations: Abdomen is soft.     Tenderness: There is no abdominal tenderness. There is no guarding or rebound.  Musculoskeletal:     Cervical back: Neck supple. No tenderness.     Right lower leg: No edema.     Left lower leg: No swelling, deformity, tenderness or bony tenderness. No edema.  Skin:    General: Skin is warm and dry.  Neurological:     General: No focal deficit present.     Mental Status: He is alert and oriented to person, place, and time.  Psychiatric:        Mood and Affect: Mood normal.        Behavior: Behavior normal.        Thought Content: Thought content normal.        Judgment: Judgment normal.      No results found for any visits on 09/06/22.  Last CBC Lab Results  Component Value Date   WBC 8.8 07/02/2022   HGB 14.4 07/02/2022   HCT 45.3 07/02/2022   MCV 92.8 07/02/2022   MCH 29.5 07/02/2022   RDW 13.0 07/02/2022   PLT 253 41/93/7902   Last metabolic panel Lab Results  Component Value Date   GLUCOSE 177 (H) 07/02/2022   NA 138 07/02/2022   K 4.9 07/02/2022   CL 103 07/02/2022   CO2 29 07/02/2022   BUN 11 07/02/2022   CREATININE 0.72 07/02/2022   EGFR 104 03/01/2022   CALCIUM 9.8 07/02/2022   PHOS 2.7 11/10/2019   PROT 8.1 07/02/2022   ALBUMIN 4.2 07/02/2022   LABGLOB 3.3 03/01/2022   AGRATIO 1.4 03/01/2022   BILITOT 0.4 07/02/2022   ALKPHOS 38 07/02/2022   AST 40 07/02/2022   ALT 50 (H) 07/02/2022   ANIONGAP 6 07/02/2022   Last lipids Lab Results  Component Value Date   CHOL 119 03/01/2022   HDL 32 (L) 03/01/2022   LDLCALC 68 03/01/2022   TRIG 97 03/01/2022   CHOLHDL 3.7 03/01/2022  Last hemoglobin A1c Lab Results  Component Value Date    HGBA1C 6.4 (H) 03/01/2022      The ASCVD Risk score (Arnett DK, et al., 2019) failed to calculate for the following reasons:   The valid total cholesterol range is 130 to 320 mg/dL    Assessment & Plan:   Dale Terrell was seen today for diabetes.  Diagnoses and all orders for this visit:  Type 2 diabetes mellitus with hyperlipidemia (HCC) A1c is 7.3 today, not at goal of <7. Continue metformin and glimepiride. Add jardiance today. Labs pending. Has appt with podiatry next week. Eye exam and micro are UTD. On aspirin, ACE, and statin. Labs pending.  -     CMP14+EGFR -     CBC with Differential/Platelet -     Lipid panel -     Bayer DCA Hb A1c Waived       -     empagliflozin (JARDIANCE) 10 MG TABS tablet; Take 1    tablet (10 mg total) by mouth daily before breakfast.  Hyperlipidemia associated with type 2 diabetes mellitus ([place]) On statin. Labs pending.   Hypertension associated with diabetes ([place]) Well controlled on current regimen. Continue lisinopril.   Chest pain at rest EKG with normal sinus rhythm today. Discussed history is consistent with pain from GERD, however will refer to cardiology for further evaluation given history of DM, HTN, and HLD.  -     EKG 12-Lead -     Cancel: Ambulatory referral to Cardiology -     Ambulatory referral to Cardiology  Gastroesophageal reflux disease, unspecified whether esophagitis present Start pepcid BID.  -     famotidine (PEPCID) 20 MG tablet; Take 1 tablet (20 mg total) by mouth 2 (two) times daily.  Depression, recurrent Well controlled on current regimen. Continue zoloft.   Left leg pain Discussed neuropathy vs lumbar radiculopathy. Discussed stretching, heat, tylenol, muscle relaxer. Follow up if symptoms worsen or persist.   Need for immunization against influenza -     Flu Vaccine QUAD 5moIM (Fluarix, Fluzone & Alfiuria Quad PF)  The above assessment and management plan was discussed with the patient. The patient verbalized  understanding of and has agreed to the management plan. Patient is aware to call the clinic if they develop any new symptoms or if symptoms fail to improve or worsen. Patient is aware when to return to the clinic for a follow-up visit. Patient educated on when it is appropriate to go to the emergency department.    Return in about 3 months (around 12/07/2022) for chronic follow up.    TGwenlyn Perking FNP

## 2022-09-06 NOTE — Patient Instructions (Signed)
Nonspecific Chest Pain, Adult Chest pain is an uncomfortable, tight, or painful feeling in the chest. The pain can feel like a crushing, aching, or squeezing pressure. A person can feel a burning or tingling sensation. Chest pain can also be felt in your back, neck, jaw, shoulder, or arm. This pain can be worse when you move, sneeze, or take a deep breath. Chest pain can be caused by a condition that is life-threatening. This must be treated right away. It can also be caused by something that is not life-threatening. If you have chest pain, it can be hard to know the difference, so it is important to get help right away to make sure that you do not have a serious condition. Some life-threatening causes of chest pain include: Heart attack. A tear in the body's main blood vessel (aortic dissection). Inflammation around your heart (pericarditis). A problem in the lungs, such as a blood clot (pulmonary embolism) or a collapsed lung (pneumothorax). Some non life-threatening causes of chest pain include: Heartburn. Anxiety or stress. Damage to the bones, muscles, and cartilage that make up your chest wall. Pneumonia or bronchitis. Shingles infection (varicella-zoster virus). Your chest pain may come and go. It may also be constant. Your health care provider will do tests and other studies to find the cause of your pain. Treatment will depend on the cause of your chest pain. Follow these instructions at home: Medicines Take over-the-counter and prescription medicines only as told by your health care provider. If you were prescribed an antibiotic medicine, take it as told by your health care provider. Do not stop taking the antibiotic even if you start to feel better. Activity Avoid any activities that cause chest pain. Do not lift anything that is heavier than 10 lb (4.5 kg), or the limit that you are told, until your health care provider says that it is safe. Rest as directed by your health care  provider. Return to your normal activities only as told by your health care provider. Ask your health care provider what activities are safe for you. Lifestyle     Do not use any products that contain nicotine or tobacco, such as cigarettes, e-cigarettes, and chewing tobacco. If you need help quitting, ask your health care provider. Do not drink alcohol. Make healthy lifestyle changes as recommended. These may include: Getting regular exercise. Ask your health care provider to suggest some exercises that are safe for you. Eating a heart-healthy diet. This includes plenty of fresh fruits and vegetables, whole grains, low-fat (lean) protein, and low-fat dairy products. A dietitian can help you find healthy eating options. Maintaining a healthy weight. Managing any other health conditions you may have, such as high blood pressure (hypertension) or diabetes. Reducing stress, such as with yoga or relaxation techniques. General instructions Pay attention to any changes in your symptoms. It is up to you to get the results of any tests that were done. Ask your health care provider, or the department that is doing the tests, when your results will be ready. Keep all follow-up visits as told by your health care provider. This is important. You may be asked to go for further testing if your chest pain does not go away. Contact a health care provider if: Your chest pain does not go away. You feel depressed. You have a fever. You notice changes in your symptoms or develop new symptoms. Get help right away if: Your chest pain gets worse. You have a cough that gets worse, or you   cough up blood. You have severe pain in your abdomen. You faint. You have sudden, unexplained chest discomfort. You have sudden, unexplained discomfort in your arms, back, neck, or jaw. You have shortness of breath at any time. You suddenly start to sweat, or your skin gets clammy. You feel nausea or you vomit. You  suddenly feel lightheaded or dizzy. You have severe weakness, or unexplained weakness or fatigue. Your heart begins to beat quickly, or it feels like it is skipping beats. These symptoms may represent a serious problem that is an emergency. Do not wait to see if the symptoms will go away. Get medical help right away. Call your local emergency services (911 in the [place] Do not drive yourself to the hospital. Summary Chest pain can be caused by a condition that is serious and requires urgent treatment. It may also be caused by something that is not life-threatening. Your health care provider may do lab tests and other studies to find the cause of your pain. Follow your health care provider's instructions on taking medicines, making lifestyle changes, and getting emergency treatment if symptoms become worse. Keep all follow-up visits as told by your health care provider. This includes visits for any further testing if your chest pain does not go away. This information is not intended to replace advice given to you by your health care provider. Make sure you discuss any questions you have with your health care provider. Document Revised: 12/28/2020 Document Reviewed: 12/28/2020 Elsevier Patient Education  2023 [employer]  

## 2022-09-07 LAB — CBC WITH DIFFERENTIAL/PLATELET
Basophils Absolute: 0.1 10*3/uL (ref 0.0–0.2)
Basos: 1 %
EOS (ABSOLUTE): 0.1 10*3/uL (ref 0.0–0.4)
Eos: 1 %
Hematocrit: 43.4 % (ref 37.5–51.0)
Hemoglobin: 14.5 g/dL (ref 13.0–17.7)
Immature Grans (Abs): 0 10*3/uL (ref 0.0–0.1)
Immature Granulocytes: 1 %
Lymphocytes Absolute: 2.2 10*3/uL (ref 0.7–3.1)
Lymphs: 25 %
MCH: 30 pg (ref 26.6–33.0)
MCHC: 33.4 g/dL (ref 31.5–35.7)
MCV: 90 fL (ref 79–97)
Monocytes Absolute: 0.7 10*3/uL (ref 0.1–0.9)
Monocytes: 8 %
Neutrophils Absolute: 5.6 10*3/uL (ref 1.4–7.0)
Neutrophils: 64 %
Platelets: 231 10*3/uL (ref 150–450)
RBC: 4.84 x10E6/uL (ref 4.14–5.80)
RDW: 12.6 % (ref 11.6–15.4)
WBC: 8.7 10*3/uL (ref 3.4–10.8)

## 2022-09-07 LAB — CMP14+EGFR
ALT: 32 IU/L (ref 0–44)
AST: 37 IU/L (ref 0–40)
Albumin/Globulin Ratio: 1.5 (ref 1.2–2.2)
Albumin: 4.6 g/dL (ref 3.8–4.9)
Alkaline Phosphatase: 45 IU/L (ref 44–121)
BUN/Creatinine Ratio: 13 (ref 9–20)
BUN: 11 mg/dL (ref 6–24)
Bilirubin Total: 0.4 mg/dL (ref 0.0–1.2)
CO2: 23 mmol/L (ref 20–29)
Calcium: 9.6 mg/dL (ref 8.7–10.2)
Chloride: 102 mmol/L (ref 96–106)
Creatinine, Ser: 0.84 mg/dL (ref 0.76–1.27)
Globulin, Total: 3 g/dL (ref 1.5–4.5)
Glucose: 83 mg/dL (ref 70–99)
Potassium: 4.8 mmol/L (ref 3.5–5.2)
Sodium: 141 mmol/L (ref 134–144)
Total Protein: 7.6 g/dL (ref 6.0–8.5)
eGFR: 102 mL/min/{1.73_m2} (ref >59)

## 2022-09-07 LAB — LIPID PANEL
Chol/HDL Ratio: 3.9 ratio (ref 0.0–5.0)
Cholesterol, Total: 121 mg/dL (ref 100–199)
HDL: 31 mg/dL — ABNORMAL LOW (ref >39)
LDL Chol Calc (NIH): 71 mg/dL (ref 0–99)
Triglycerides: 97 mg/dL (ref 0–149)
VLDL Cholesterol Cal: 19 mg/dL (ref 5–40)

## 2022-09-11 DIAGNOSIS — M79672 Pain in left foot: Secondary | ICD-10-CM | POA: Diagnosis not present

## 2022-09-11 DIAGNOSIS — E1140 Type 2 diabetes mellitus with diabetic neuropathy, unspecified: Secondary | ICD-10-CM | POA: Diagnosis not present

## 2022-09-11 DIAGNOSIS — M79674 Pain in right toe(s): Secondary | ICD-10-CM | POA: Diagnosis not present

## 2022-09-11 DIAGNOSIS — I739 Peripheral vascular disease, unspecified: Secondary | ICD-10-CM | POA: Diagnosis not present

## 2022-09-11 DIAGNOSIS — M79675 Pain in left toe(s): Secondary | ICD-10-CM | POA: Diagnosis not present

## 2022-09-11 DIAGNOSIS — M79671 Pain in right foot: Secondary | ICD-10-CM | POA: Diagnosis not present

## 2022-10-07 ENCOUNTER — Ambulatory Visit: Payer: Medicare Other | Admitting: Internal Medicine

## 2022-11-13 ENCOUNTER — Other Ambulatory Visit: Payer: Self-pay | Admitting: Family Medicine

## 2022-11-14 ENCOUNTER — Other Ambulatory Visit: Payer: Self-pay | Admitting: Family Medicine

## 2022-11-26 ENCOUNTER — Ambulatory Visit (INDEPENDENT_AMBULATORY_CARE_PROVIDER_SITE_OTHER): Payer: 59

## 2022-11-26 NOTE — Progress Notes (Signed)
Patient requesting to reschedule his appointment

## 2022-12-11 DIAGNOSIS — M79674 Pain in right toe(s): Secondary | ICD-10-CM | POA: Diagnosis not present

## 2022-12-11 DIAGNOSIS — E1140 Type 2 diabetes mellitus with diabetic neuropathy, unspecified: Secondary | ICD-10-CM | POA: Diagnosis not present

## 2022-12-11 DIAGNOSIS — M79672 Pain in left foot: Secondary | ICD-10-CM | POA: Diagnosis not present

## 2022-12-11 DIAGNOSIS — M79675 Pain in left toe(s): Secondary | ICD-10-CM | POA: Diagnosis not present

## 2022-12-11 DIAGNOSIS — M79671 Pain in right foot: Secondary | ICD-10-CM | POA: Diagnosis not present

## 2022-12-11 DIAGNOSIS — I739 Peripheral vascular disease, unspecified: Secondary | ICD-10-CM | POA: Diagnosis not present

## 2022-12-12 ENCOUNTER — Ambulatory Visit (INDEPENDENT_AMBULATORY_CARE_PROVIDER_SITE_OTHER): Payer: 59 | Admitting: Family Medicine

## 2022-12-12 ENCOUNTER — Encounter: Payer: Self-pay | Admitting: Family Medicine

## 2022-12-12 VITALS — BP 120/60 | HR 85 | Temp 98.1°F | Ht 67.0 in | Wt 194.2 lb

## 2022-12-12 DIAGNOSIS — E785 Hyperlipidemia, unspecified: Secondary | ICD-10-CM | POA: Diagnosis not present

## 2022-12-12 DIAGNOSIS — K219 Gastro-esophageal reflux disease without esophagitis: Secondary | ICD-10-CM

## 2022-12-12 DIAGNOSIS — F339 Major depressive disorder, recurrent, unspecified: Secondary | ICD-10-CM

## 2022-12-12 DIAGNOSIS — B353 Tinea pedis: Secondary | ICD-10-CM

## 2022-12-12 DIAGNOSIS — E1165 Type 2 diabetes mellitus with hyperglycemia: Secondary | ICD-10-CM | POA: Diagnosis not present

## 2022-12-12 DIAGNOSIS — E1159 Type 2 diabetes mellitus with other circulatory complications: Secondary | ICD-10-CM | POA: Diagnosis not present

## 2022-12-12 DIAGNOSIS — I152 Hypertension secondary to endocrine disorders: Secondary | ICD-10-CM | POA: Diagnosis not present

## 2022-12-12 DIAGNOSIS — E6609 Other obesity due to excess calories: Secondary | ICD-10-CM

## 2022-12-12 DIAGNOSIS — E1169 Type 2 diabetes mellitus with other specified complication: Secondary | ICD-10-CM

## 2022-12-12 DIAGNOSIS — E66811 Obesity, class 1: Secondary | ICD-10-CM

## 2022-12-12 DIAGNOSIS — Z6830 Body mass index (BMI) 30.0-30.9, adult: Secondary | ICD-10-CM

## 2022-12-12 LAB — BAYER DCA HB A1C WAIVED: HB A1C (BAYER DCA - WAIVED): 6.3 % — ABNORMAL HIGH (ref 4.8–5.6)

## 2022-12-12 MED ORDER — SERTRALINE HCL 100 MG PO TABS: 100.0000 mg | ORAL_TABLET | Freq: Every day | ORAL | 3 refills | Status: DC

## 2022-12-12 MED ORDER — FAMOTIDINE 20 MG PO TABS: 20.0000 mg | ORAL_TABLET | Freq: Two times a day (BID) | ORAL | 1 refills | Status: DC

## 2022-12-12 NOTE — Addendum Note (Signed)
Addended by: Gwenlyn Perking on: 12/12/2022 04:08 PM   Modules accepted: Level of Service

## 2022-12-12 NOTE — Progress Notes (Signed)
Established Patient Office Visit  Subjective   Patient ID: Dale Terrell, male    DOB: 03/25/1966  Age: 57 y.o. MRN: [id number]  Chief Complaint  Patient presents with   Medical Management of Chronic Issues   Diabetes    HPI  DM Patient denies foot ulcerations, increased appetite, nausea, paresthesia of the feet, polydipsia, polyuria, visual disturbances, vomiting, and weight loss.  Current diabetic medications include metformin, glimepiride, jardiance Compliant with meds - Yes  Current monitoring regimen: home blood tests - 2 times daily Home blood sugar records:  130s Any episodes of hypoglycemia? no  Eye exam current (within one year): yes Weight trend: stable Current diet: in general, a "healthy" diet   Current exercise: none  Is He on ACE inhibitor or angiotensin II receptor blocker?  Yes, lisinopril Is He on statin? Yes simvastatin Is He on ASA 81 mg daily?  Yes  He sees podiatry in [place]. He saw them yesterday. They sent in a cream for athlete's foot and trimmed his nails.   2. HTN Complaint with meds - Yes Current Medications - lisinopril Pertinent ROS:  Headache - No Fatigue - No Visual Disturbances - No Dyspnea - No Palpitations - No LE edema - No Chest pain -  No  3. HLD On simvastatin. Last LDL was 71. Denies myalgias.   4. Depression/anxiety  He takes zoloft daily. Reports well controlled.   5. GERD Compliant with medications - Yes Current medications - pepcid BID Cough - No Sore throat - No Voice change - No Hemoptysis - No Dysphagia or dyspepsia - No Water brash - No Red Flags (weight loss, hematochezia, melena, weight loss, early satiety, fevers, odynophagia, or persistent vomiting) - No  6. Obesity He has been cutting back portions and trying to only eat if he is hungry.      12/12/2022   10:48 AM 09/06/2022    9:30 AM 09/06/2022    9:29 AM  Depression screen PHQ 2/9  Decreased Interest 0  0  Down, Depressed, Hopeless 0  1   PHQ - 2 Score 0  1  Altered sleeping 0 0   Tired, decreased energy 1 1   Change in appetite 0 0   Feeling bad or failure about yourself  0 0   Trouble concentrating 0 0   Moving slowly or fidgety/restless 0 0   Suicidal thoughts 0 0   PHQ-9 Score 1    Difficult doing work/chores Not difficult at all Not difficult at all       12/12/2022   10:49 AM 09/06/2022    9:30 AM 06/24/2022    1:41 PM 03/01/2022   10:12 AM  GAD 7 : Generalized Anxiety Score  Nervous, Anxious, on Edge 0 3 0 0  Control/stop worrying 0 0 0 0  Worry too much - different things 0 0 1 0  Trouble relaxing 0 0 0 0  Restless 0 0 0 0  Easily annoyed or irritable 0 0 0 0  Afraid - awful might happen 0 0 0 0  Total GAD 7 Score 0 3 1 0  Anxiety Difficulty Not difficult at all Not difficult at all Not difficult at all Not difficult at all     ROS As per HPI.    Objective:     BP 120/60   Pulse 85   Temp 98.1 F (36.7 C) (Temporal)   Ht 5' 7"$  (1.702 m)   Wt 194 lb 4 oz (88.1 kg)  SpO2 97%   BMI 30.42 kg/m  BP Readings from Last 3 Encounters:  12/12/22 120/60  09/06/22 114/69  07/02/22 126/80   Wt Readings from Last 3 Encounters:  12/12/22 194 lb 4 oz (88.1 kg)  09/06/22 202 lb (91.6 kg)  07/02/22 205 lb (93 kg)     Physical Exam Vitals and nursing note reviewed.  Constitutional:      General: He is not in acute distress.    Appearance: He is not ill-appearing, toxic-appearing or diaphoretic.  HENT:     Head: Normocephalic and atraumatic.  Neck:     Thyroid: No thyroid mass, thyromegaly or thyroid tenderness.     Vascular: No carotid bruit.  Cardiovascular:     Rate and Rhythm: Normal rate and regular rhythm.     Heart sounds: Normal heart sounds. No murmur heard. Pulmonary:     Effort: Pulmonary effort is normal. No respiratory distress.     Breath sounds: Normal breath sounds.  Abdominal:     General: Bowel sounds are normal. There is no distension.     Palpations: Abdomen is soft.      Tenderness: There is no abdominal tenderness. There is no guarding or rebound.  Musculoskeletal:     Cervical back: Neck supple. No rigidity.     Right lower leg: No edema.     Left lower leg: No swelling, deformity, tenderness or bony tenderness. No edema.  Skin:    General: Skin is warm and dry.  Neurological:     General: No focal deficit present.     Mental Status: He is alert and oriented to person, place, and time.  Psychiatric:        Mood and Affect: Mood normal.        Behavior: Behavior normal.        Thought Content: Thought content normal.        Judgment: Judgment normal.    Diabetic Foot Exam - Simple   Simple Foot Form Diabetic Foot exam was performed with the following findings: Yes 12/12/2022 11:17 AM  Visual Inspection Sensation Testing Intact to touch and monofilament testing bilaterally: Yes Pulse Check Posterior Tibialis and Dorsalis pulse intact bilaterally: Yes Comments Tinea pedias present bilaterally. Yellow, thick toe nails. No skin breakdown or ulcers.       No results found for any visits on 12/12/22.  Last CBC Lab Results  Component Value Date   WBC 8.7 09/06/2022   HGB 14.5 09/06/2022   HCT 43.4 09/06/2022   MCV 90 09/06/2022   MCH 30.0 09/06/2022   RDW 12.6 09/06/2022   PLT 231 [id number]   Last metabolic panel Lab Results  Component Value Date   GLUCOSE 83 09/06/2022   NA 141 09/06/2022   K 4.8 09/06/2022   CL 102 09/06/2022   CO2 23 09/06/2022   BUN 11 09/06/2022   CREATININE 0.84 09/06/2022   EGFR 102 09/06/2022   CALCIUM 9.6 09/06/2022   PHOS 2.7 11/10/2019   PROT 7.6 09/06/2022   ALBUMIN 4.6 09/06/2022   LABGLOB 3.0 09/06/2022   AGRATIO 1.5 09/06/2022   BILITOT 0.4 09/06/2022   ALKPHOS 45 09/06/2022   AST 37 09/06/2022   ALT 32 09/06/2022   ANIONGAP 6 07/02/2022   Last lipids Lab Results  Component Value Date   CHOL 121 09/06/2022   HDL 31 (L) 09/06/2022   LDLCALC 71 09/06/2022   TRIG 97 09/06/2022    CHOLHDL 3.9 09/06/2022   Last hemoglobin A1c Lab Results  Component Value Date   HGBA1C 7.3 (H) 09/06/2022      The ASCVD Risk score (Arnett DK, et al., 2019) failed to calculate for the following reasons:   The valid total cholesterol range is 130 to 320 mg/dL    Assessment & Plan:   Kashis was seen today for medical management of chronic issues and diabetes.  Diagnoses and all orders for this visit:  Type 2 diabetes mellitus with hyperglycemia, without long-term current use of insulin (HCC) A1c at goal of <7. Continue metformin, glimepiride, and jardiance. Foot exam today. Eye and micro are UTD. On ACE and statin. Labs pending.  -     Vitamin B12 -     Bayer DCA Hb A1c Waived  Hypertension associated with diabetes ([place]) Well controlled on current regimen. Continue lisinopril.   Hyperlipidemia associated with type 2 diabetes mellitus (HCC) LDL at goal. Continue simvastatin.   Gastroesophageal reflux disease, unspecified whether esophagitis present Well controlled on current regimen.  -     famotidine (PEPCID) 20 MG tablet; Take 1 tablet (20 mg total) by mouth 2 (two) times daily.  Depression, recurrent ([place]) Well controlled on current regimen.  -     sertraline (ZOLOFT) 100 MG tablet; Take 1 tablet (100 mg total) by mouth daily.  Class 1 obesity due to excess calories with serious comorbidity and body mass index (BMI) of 30.0 to 30.9 in adult Diet and exercise. He has lost 7 lbs since his last visit.   Tinea pedis of both feet Continue treatment prescribed by podiatry.   Return in about 3 months (around 03/12/2023) for chronic follow up.   The patient indicates understanding of these issues and agrees with the plan.  Gwenlyn Perking, FNP

## 2022-12-13 LAB — VITAMIN B12: Vitamin B-12: 461 pg/mL (ref 232–1245)

## 2022-12-30 ENCOUNTER — Other Ambulatory Visit: Payer: Self-pay | Admitting: Family Medicine

## 2023-02-12 ENCOUNTER — Other Ambulatory Visit: Payer: Self-pay | Admitting: Family Medicine

## 2023-03-06 LAB — HM DIABETES EYE EXAM

## 2023-03-12 DIAGNOSIS — M79672 Pain in left foot: Secondary | ICD-10-CM | POA: Diagnosis not present

## 2023-03-12 DIAGNOSIS — M79675 Pain in left toe(s): Secondary | ICD-10-CM | POA: Diagnosis not present

## 2023-03-12 DIAGNOSIS — M79674 Pain in right toe(s): Secondary | ICD-10-CM | POA: Diagnosis not present

## 2023-03-12 DIAGNOSIS — M79671 Pain in right foot: Secondary | ICD-10-CM | POA: Diagnosis not present

## 2023-03-12 DIAGNOSIS — I739 Peripheral vascular disease, unspecified: Secondary | ICD-10-CM | POA: Diagnosis not present

## 2023-03-12 DIAGNOSIS — E1140 Type 2 diabetes mellitus with diabetic neuropathy, unspecified: Secondary | ICD-10-CM | POA: Diagnosis not present

## 2023-03-14 ENCOUNTER — Ambulatory Visit (INDEPENDENT_AMBULATORY_CARE_PROVIDER_SITE_OTHER): Payer: 59 | Admitting: Family Medicine

## 2023-03-14 ENCOUNTER — Encounter: Payer: Self-pay | Admitting: Family Medicine

## 2023-03-14 VITALS — BP 112/67 | HR 83 | Temp 98.2°F | Ht 67.0 in | Wt 193.6 lb

## 2023-03-14 DIAGNOSIS — E6609 Other obesity due to excess calories: Secondary | ICD-10-CM

## 2023-03-14 DIAGNOSIS — K219 Gastro-esophageal reflux disease without esophagitis: Secondary | ICD-10-CM | POA: Diagnosis not present

## 2023-03-14 DIAGNOSIS — Z7984 Long term (current) use of oral hypoglycemic drugs: Secondary | ICD-10-CM | POA: Diagnosis not present

## 2023-03-14 DIAGNOSIS — E785 Hyperlipidemia, unspecified: Secondary | ICD-10-CM | POA: Diagnosis not present

## 2023-03-14 DIAGNOSIS — E1169 Type 2 diabetes mellitus with other specified complication: Secondary | ICD-10-CM

## 2023-03-14 DIAGNOSIS — I152 Hypertension secondary to endocrine disorders: Secondary | ICD-10-CM | POA: Diagnosis not present

## 2023-03-14 DIAGNOSIS — E1159 Type 2 diabetes mellitus with other circulatory complications: Secondary | ICD-10-CM | POA: Diagnosis not present

## 2023-03-14 DIAGNOSIS — Z6830 Body mass index (BMI) 30.0-30.9, adult: Secondary | ICD-10-CM

## 2023-03-14 DIAGNOSIS — M62838 Other muscle spasm: Secondary | ICD-10-CM | POA: Diagnosis not present

## 2023-03-14 DIAGNOSIS — E1165 Type 2 diabetes mellitus with hyperglycemia: Secondary | ICD-10-CM

## 2023-03-14 DIAGNOSIS — F339 Major depressive disorder, recurrent, unspecified: Secondary | ICD-10-CM

## 2023-03-14 LAB — BAYER DCA HB A1C WAIVED: HB A1C (BAYER DCA - WAIVED): 6.1 % — ABNORMAL HIGH (ref 4.8–5.6)

## 2023-03-14 MED ORDER — TIZANIDINE HCL 4 MG PO TABS
ORAL_TABLET | ORAL | 1 refills | Status: DC
Start: 2023-03-14 — End: 2023-07-22

## 2023-03-14 MED ORDER — SIMVASTATIN 40 MG PO TABS
40.0000 mg | ORAL_TABLET | Freq: Every day | ORAL | 3 refills | Status: DC
Start: 2023-03-14 — End: 2024-03-23

## 2023-03-14 NOTE — Progress Notes (Signed)
Established Patient Office Visit  Subjective   Patient ID: Dale Terrell, male    DOB: 1966/05/01  Age: 57 y.o. MRN: [id number]  Chief Complaint  Patient presents with   Medical Management of Chronic Issues    3 month chronic follow up     HPI  DM Patient denies foot ulcerations, increased appetite, nausea, paresthesia of the feet, polydipsia, polyuria, visual disturbances, vomiting, and weight loss.  Current diabetic medications include metformin, glimepiride, jardiance Compliant with meds - Yes  Current monitoring regimen: home blood tests - 2 times daily Home blood sugar records:  130s Any episodes of hypoglycemia? no  Eye exam current (within one year): yes Weight trend: stable Current diet: in general, a "healthy" diet   Current exercise: none  Is He on ACE inhibitor or angiotensin II receptor blocker?  Yes, lisinopril Is He on statin? Yes simvastatin Is He on ASA 81 mg daily?  Yes  He sees podiatry in [place]. He saw them yesterday. They sent in a cream for athlete's foot and trimmed his nails.   2. HTN Complaint with meds - Yes Current Medications - lisinopril Pertinent ROS:  Headache - No Fatigue - No Visual Disturbances - No Dyspnea - No Palpitations - No LE edema - No Chest pain -  No  3. HLD On simvastatin. Last LDL was 71. Denies myalgias.   4. Depression/anxiety  He takes zoloft daily. Reports well controlled.   5. GERD Compliant with medications - Yes Current medications - pepcid BID Cough - No Sore throat - No Voice change - No Hemoptysis - No Dysphagia or dyspepsia - No Water brash - No Red Flags (weight loss, hematochezia, melena, weight loss, early satiety, fevers, odynophagia, or persistent vomiting) - No  6. Obesity Weight has been stable.   He would like a refill on zanaflex today for muscle spasms in his lower leg. Denies swelling, erythema, tenderness.      03/14/2023   10:48 AM 12/12/2022   10:48 AM 09/06/2022    9:30 AM   Depression screen PHQ 2/9  Decreased Interest 0 0   Down, Depressed, Hopeless 0 0   PHQ - 2 Score 0 0   Altered sleeping 0 0 0  Tired, decreased energy 1 1 1   Change in appetite 0 0 0  Feeling bad or failure about yourself  0 0 0  Trouble concentrating 0 0 0  Moving slowly or fidgety/restless 0 0 0  Suicidal thoughts 0 0 0  PHQ-9 Score 1 1   Difficult doing work/chores Not difficult at all Not difficult at all Not difficult at all      03/14/2023   10:48 AM 12/12/2022   10:49 AM 09/06/2022    9:30 AM 06/24/2022    1:41 PM  GAD 7 : Generalized Anxiety Score  Nervous, Anxious, on Edge 1 0 3 0  Control/stop worrying 0 0 0 0  Worry too much - different things 1 0 0 1  Trouble relaxing 0 0 0 0  Restless 0 0 0 0  Easily annoyed or irritable 0 0 0 0  Afraid - awful might happen 0 0 0 0  Total GAD 7 Score 2 0 3 1  Anxiety Difficulty Not difficult at all Not difficult at all Not difficult at all Not difficult at all     ROS As per HPI.    Objective:     BP 112/67   Pulse 83   Temp 98.2 F (36.8 C) (  Temporal)   Ht 5\' 7"  (1.702 m)   Wt 193 lb 9.6 oz (87.8 kg)   SpO2 98%   BMI 30.32 kg/m  BP Readings from Last 3 Encounters:  03/14/23 112/67  12/12/22 120/60  09/06/22 114/69   Wt Readings from Last 3 Encounters:  03/14/23 193 lb 9.6 oz (87.8 kg)  12/12/22 194 lb 4 oz (88.1 kg)  09/06/22 202 lb (91.6 kg)     Physical Exam Vitals and nursing note reviewed.  Constitutional:      General: He is not in acute distress.    Appearance: He is not ill-appearing, toxic-appearing or diaphoretic.  HENT:     Head: Normocephalic and atraumatic.  Neck:     Thyroid: No thyroid mass, thyromegaly or thyroid tenderness.     Vascular: No carotid bruit.  Cardiovascular:     Rate and Rhythm: Normal rate and regular rhythm.     Heart sounds: Normal heart sounds. No murmur heard. Pulmonary:     Effort: Pulmonary effort is normal. No respiratory distress.     Breath sounds: Normal  breath sounds.  Abdominal:     General: Bowel sounds are normal. There is no distension.     Palpations: Abdomen is soft.     Tenderness: There is no abdominal tenderness. There is no guarding or rebound.  Musculoskeletal:     Cervical back: Neck supple. No rigidity.     Right lower leg: No edema.     Left lower leg: No swelling, deformity, tenderness or bony tenderness. No edema.  Skin:    General: Skin is warm and dry.  Neurological:     General: No focal deficit present.     Mental Status: He is alert and oriented to person, place, and time.  Psychiatric:        Mood and Affect: Mood normal.        Behavior: Behavior normal.        Thought Content: Thought content normal.        Judgment: Judgment normal.    Diabetic Foot Exam - Simple   No data filed      No results found for any visits on 03/14/23.  Last CBC Lab Results  Component Value Date   WBC 8.7 09/06/2022   HGB 14.5 09/06/2022   HCT 43.4 09/06/2022   MCV 90 09/06/2022   MCH 30.0 09/06/2022   RDW 12.6 09/06/2022   PLT 231 09/06/2022   Last metabolic panel Lab Results  Component Value Date   GLUCOSE 83 09/06/2022   NA 141 09/06/2022   K 4.8 09/06/2022   CL 102 09/06/2022   CO2 23 09/06/2022   BUN 11 09/06/2022   CREATININE 0.84 09/06/2022   EGFR 102 09/06/2022   CALCIUM 9.6 09/06/2022   PHOS 2.7 11/10/2019   PROT 7.6 09/06/2022   ALBUMIN 4.6 09/06/2022   LABGLOB 3.0 09/06/2022   AGRATIO 1.5 09/06/2022   BILITOT 0.4 09/06/2022   ALKPHOS 45 09/06/2022   AST 37 09/06/2022   ALT 32 09/06/2022   ANIONGAP 6 07/02/2022   Last lipids Lab Results  Component Value Date   CHOL 121 09/06/2022   HDL 31 (L) 09/06/2022   LDLCALC 71 09/06/2022   TRIG 97 09/06/2022   CHOLHDL 3.9 09/06/2022   Last hemoglobin A1c Lab Results  Component Value Date   HGBA1C 6.3 (H) 12/12/2022      The ASCVD Risk score (Arnett DK, et al., 2019) failed to calculate for the following reasons:  The valid total  cholesterol range is 130 to 320 mg/dL    Assessment & Plan:   Dale Terrell was seen today for medical management of chronic issues.  Diagnoses and all orders for this visit:  Type 2 diabetes mellitus with hyperglycemia, without long-term current use of insulin (HCC) A1c 6.1 today, at goal of <7. Medication changes today: none. He is on an ACE/ARB and statin. Eye exam: UTD. Foot exam: UTD. Urine micro: today. Diet and exercise.  -     Bayer DCA Hb A1c Waived -     Microalbumin / creatinine urine ratio  Hypertension associated with diabetes (HCC) Well controlled on current regimen.   Hyperlipidemia associated with type 2 diabetes mellitus (HCC) Last LDL at goal. Well controlled on current regimen.  -     simvastatin (ZOCOR) 40 MG tablet; Take 1 tablet (40 mg total) by mouth at bedtime.  Depression, recurrent (HCC) Well controlled on current regimen.   Class 1 obesity due to excess calories with serious comorbidity and body mass index (BMI) of 30.0 to 30.9 in adult Stable weight. Diet and exercise.   Muscle spasms of lower extremity Refill provided.  -     tiZANidine (ZANAFLEX) 4 MG tablet; TAKE (1) TABLET BY MOUTH EVERY EIGHT HOURS AS NEEDED.  Return in 3 months (on 06/14/2023) for chronif follow up, schedule AWV.   The patient indicates understanding of these issues and agrees with the plan.  Gabriel Earing, FNP

## 2023-03-15 LAB — MICROALBUMIN / CREATININE URINE RATIO
Creatinine, Urine: 138.3 mg/dL
Microalb/Creat Ratio: 14 mg/g creat (ref 0–29)
Microalbumin, Urine: 18.8 ug/mL

## 2023-03-20 ENCOUNTER — Ambulatory Visit (INDEPENDENT_AMBULATORY_CARE_PROVIDER_SITE_OTHER): Payer: 59 | Admitting: *Deleted

## 2023-03-20 DIAGNOSIS — Z0000 Encounter for general adult medical examination without abnormal findings: Secondary | ICD-10-CM | POA: Diagnosis not present

## 2023-03-20 NOTE — Progress Notes (Signed)
MEDICARE ANNUAL WELLNESS VISIT  03/20/2023  Telephone Visit Disclaimer This Medicare AWV was conducted by telephone due to national recommendations for restrictions regarding the COVID-19 Pandemic (e.g. social distancing).  I verified, using two identifiers, that I am speaking with Dale Terrell or their authorized healthcare agent. I discussed the limitations, risks, security, and privacy concerns of performing an evaluation and management service by telephone and the potential availability of an in-person appointment in the future. The patient expressed understanding and agreed to proceed.  Location of Patient: Home Location of Provider (nurse):  Office  Subjective:    Dale Terrell is a 57 y.o. male patient of Gabriel Earing, FNP who had a Medicare Annual Wellness Visit today via telephone. Williom is Retired and lives with their family. he has 1 daughter that is 33. he reports that he is socially active and does interact with friends/family regularly. he is moderately physically active and enjoys fishing and watching the [employer] during football season.  Patient Care Team: Gabriel Earing, FNP as PCP - General (Family Medicine) West Bali, MD (Inactive) as Consulting Physician (Gastroenterology)     03/20/2023    8:31 AM 07/02/2022    5:30 PM 11/23/2021   12:04 PM 11/08/2019    9:17 AM 11/05/2019   10:00 AM 01/05/2019    9:49 AM 12/30/2018   10:31 AM  Advanced Directives  Does Patient Have a Medical Advance Directive? No No No  No No No  Would patient like information on creating a medical advance directive? No - Patient declined  No - Patient declined No - Patient declined No - Patient declined No - Patient declined No - Patient declined    Hospital Utilization Over the Past 12 Months: # of hospitalizations or ER visits: 0 # of surgeries: 0  Review of Systems    Patient reports that his overall health is unchanged compared to last year.  History obtained  from chart review and the patient  Patient Reported Readings (BP, Pulse, CBG, Weight, etc) none  Pain Assessment Pain : No/denies pain     Current Medications & Allergies (verified) Allergies as of 03/20/2023       Reactions   Bee Venom Itching, Swelling   Ciprofloxacin Hives, Itching        Medication List        Accurate as of Mar 20, 2023  8:44 AM. If you have any questions, ask your nurse or doctor.          aspirin EC 81 MG tablet Take 81 mg by mouth daily.   cyanocobalamin 1000 MCG tablet Commonly known as: VITAMIN B12 Take 1,000 mcg by mouth daily.   empagliflozin 10 MG Tabs tablet Commonly known as: Jardiance Take 1 tablet (10 mg total) by mouth daily before breakfast.   famotidine 20 MG tablet Commonly known as: Pepcid Take 1 tablet (20 mg total) by mouth 2 (two) times daily.   glimepiride 4 MG tablet Commonly known as: AMARYL TAKE 2 TABLETS BY MOUTH 30 MINUTES BEFORE BREAKFAST DAILY.   lisinopril 5 MG tablet Commonly known as: ZESTRIL TAKE (1) TABLET BY MOUTH ONCE DAILY.   metFORMIN 1000 MG tablet Commonly known as: GLUCOPHAGE TAKE 1 TABLET BY MOUTH TWICE DAILY WITH MEALS.   OneTouch Ultra test strip Generic drug: glucose blood Test BS BID Dx E11.69   sertraline 100 MG tablet Commonly known as: ZOLOFT Take 1 tablet (100 mg total) by mouth daily.   simvastatin  40 MG tablet Commonly known as: ZOCOR Take 1 tablet (40 mg total) by mouth at bedtime.   tiZANidine 4 MG tablet Commonly known as: ZANAFLEX TAKE (1) TABLET BY MOUTH EVERY EIGHT HOURS AS NEEDED.        History (reviewed): Past Medical History:  Diagnosis Date   Chronic back pain    Depression    on zoloft   Diabetes mellitus without complication (HCC)    Hyperlipidemia    Hypertension    Seizure (HCC)    as a child, umknown etiology and on no meds ever, only had 1.   Past Surgical History:  Procedure Laterality Date   CHOLECYSTECTOMY N/A 11/08/2019   Procedure:  ATTEMPTED LAPAROSCOPIC CHOLECYSTECTOMY; OPEN PARTIAL CHOLECYSTECTOMY;  Surgeon: Franky Macho, MD;  Location: [facility];  Service: General;  Laterality: N/A;  LAPAROTOMY AT 1156   COLONOSCOPY WITH PROPOFOL N/A 01/05/2019   Procedure: COLONOSCOPY WITH PROPOFOL;  Surgeon: West Bali, MD;  Location: [facility];  Service: Endoscopy;  Laterality: N/A;  12:00pm   cyst on arm Right    ESOPHAGOGASTRODUODENOSCOPY (EGD) WITH PROPOFOL  01/05/2019   Procedure: ESOPHAGOGASTRODUODENOSCOPY (EGD) WITH PROPOFOL;  Surgeon: West Bali, MD;  Location: [facility];  Service: Endoscopy;;   POLYPECTOMY  01/05/2019   Procedure: POLYPECTOMY;  Surgeon: West Bali, MD;  Location: [facility];  Service: Endoscopy;;   Family History  Problem Relation Age of Onset   Stroke Mother    Arthritis Mother    Heart attack Father        51"s   Hearing loss Maternal Grandmother    Colon cancer Neg Hx    Colon polyps Neg Hx    Social History   Socioeconomic History   Marital status: Single    Spouse name: Not on file   Number of children: 1   Years of education: 12   Highest education level: High school graduate  Occupational History   Occupation: disabled  Tobacco Use   Smoking status: Former    Packs/day: 0.25    Years: 2.00    Additional pack years: 0.00    Total pack years: 0.50    Types: Cigarettes    Quit date: 2021    Years since quitting: 3.3   Smokeless tobacco: Never  Vaping Use   Vaping Use: Never used  Substance and Sexual Activity   Alcohol use: Not Currently   Drug use: No   Sexual activity: Not Currently    Birth control/protection: None  Other Topics Concern   Not on file  Social History Narrative   Lives with his aunt and brother   Social Determinants of Health   Financial Resource Strain: Low Risk  (03/20/2023)   Overall Financial Resource Strain (CARDIA)    Difficulty of Paying Living Expenses: Not hard at all  Food Insecurity: No Food Insecurity (03/20/2023)    Hunger Vital Sign    Worried About Running Out of Food in the Last Year: Never true    Ran Out of Food in the Last Year: Never true  Transportation Needs: No Transportation Needs (03/20/2023)   PRAPARE - Administrator, Civil Service (Medical): No    Lack of Transportation (Non-Medical): No  Physical Activity: Insufficiently Active (03/20/2023)   Exercise Vital Sign    Days of Exercise per Week: 5 days    Minutes of Exercise per Session: 20 min  Stress: No Stress Concern Present (11/23/2021)   [employer] of Occupational Health -  Occupational Stress Questionnaire    Feeling of Stress : Not at all  Social Connections: Moderately Isolated (03/20/2023)   Social Connection and Isolation Panel [NHANES]    Frequency of Communication with Friends and Family: More than three times a week    Frequency of Social Gatherings with Friends and Family: More than three times a week    Attends Religious Services: Never    Database administrator or Organizations: No    Attends Banker Meetings: Never    Marital Status: Living with partner    Activities of Daily Living    03/20/2023    8:32 AM  In your present state of health, do you have any difficulty performing the following activities:  Hearing? 0  Vision? 0  Comment Diabetic Eye Exam-1 week ago at [facility] in [place]  Difficulty concentrating or making decisions? 0  Walking or climbing stairs? 0  Dressing or bathing? 0  Doing errands, shopping? 0  Preparing Food and eating ? N  Using the Toilet? N  In the past six months, have you accidently leaked urine? N  Do you have problems with loss of bowel control? N  Managing your Medications? N  Managing your Finances? N  Housekeeping or managing your Housekeeping? N    Patient Education/ Literacy How often do you need to have someone help you when you read instructions, pamphlets, or other written materials from your doctor or pharmacy?: 2 - Rarely What is the  last grade level you completed in school?: 12th Grade  Exercise Current Exercise Habits: Home exercise routine, Type of exercise: walking, Time (Minutes): 20, Frequency (Times/Week): 5, Weekly Exercise (Minutes/Week): 100, Intensity: Mild, Exercise limited by: None identified  Diet Patient reports consuming 2 meals a day and 2 snack(s) a day Patient reports that his primary diet is: Regular Patient reports that she does have regular access to food.   Depression Screen    03/20/2023    8:32 AM 03/14/2023   10:48 AM 12/12/2022   10:48 AM 09/06/2022    9:29 AM 06/24/2022    1:41 PM 03/01/2022   10:12 AM 11/27/2021   10:17 AM  PHQ 2/9 Scores  PHQ - 2 Score 0 0 0 1 1 0 0  PHQ- 9 Score  1 1  2  0      Fall Risk    03/20/2023    8:32 AM 03/14/2023   10:48 AM 12/12/2022   10:48 AM 09/06/2022    9:19 AM 06/24/2022    1:41 PM  Fall Risk   Falls in the past year? 0 0 0 1 0  Number falls in past yr:    0   Injury with Fall?    0   Risk for fall due to :    History of fall(s)   Follow up    Falls evaluation completed      Objective:  Dale Terrell seemed alert and oriented and he participated appropriately during our telephone visit.  Blood Pressure Weight BMI  BP Readings from Last 3 Encounters:  03/14/23 112/67  12/12/22 120/60  09/06/22 114/69   Wt Readings from Last 3 Encounters:  03/14/23 193 lb 9.6 oz (87.8 kg)  12/12/22 194 lb 4 oz (88.1 kg)  09/06/22 202 lb (91.6 kg)   BMI Readings from Last 1 Encounters:  03/14/23 30.32 kg/m    *Unable to obtain current vital signs, weight, and BMI due to telephone visit type  Hearing/Vision  Fayrene Fearing  did not seem to have difficulty with hearing/understanding during the telephone conversation Reports that he has had a formal eye exam by an eye care professional within the past year Reports that he has not had a formal hearing evaluation within the past year *Unable to fully assess hearing and vision during telephone visit  type  Cognitive Function:    03/20/2023    8:35 AM 11/23/2021   12:06 PM  6CIT Screen  What Year? 0 points 0 points  What month? 0 points 0 points  What time? 0 points 0 points  Count back from 20 0 points 0 points  Months in reverse 0 points 0 points  Repeat phrase 4 points 6 points  Total Score 4 points 6 points   (Normal:0-7, Significant for Dysfunction: >8)  Normal Cognitive Function Screening: Yes   Immunization & Health Maintenance Record Immunization History  Administered Date(s) Administered   Influenza Inj Mdck Quad Pf 11/13/2017, 08/15/2021   Influenza Split 07/28/2014   Influenza, High Dose Seasonal PF 11/09/2019   Influenza,inj,Quad PF,6+ Mos 11/09/2019, 08/03/2020, 09/06/2022   Influenza,inj,quad, With Preservative 08/03/2020   Influenza,trivalent, recombinat, inj, PF 07/28/2014   Influenza-Unspecified 08/03/2020   Moderna Sars-Covid-2 Vaccination 02/18/2020, 03/16/2020   Pneumococcal Polysaccharide-23 10/18/2014, 11/09/2019   Pneumococcal-Unspecified 11/09/2019   Tdap 11/13/2017   Zoster Recombinat (Shingrix) 11/27/2021, 03/01/2022    Health Maintenance  Topic Date Due   Medicare Annual Wellness (AWV)  11/23/2022   OPHTHALMOLOGY EXAM  03/05/2023   Hepatitis C Screening  12/13/2023 (Originally 04/19/1984)   COVID-19 Vaccine (3 - Moderna risk series) 12/28/2023 (Originally 04/13/2020)   INFLUENZA VACCINE  05/29/2023   Diabetic kidney evaluation - eGFR measurement  09/07/2023   HEMOGLOBIN A1C  09/14/2023   FOOT EXAM  12/13/2023   Diabetic kidney evaluation - Urine ACR  03/13/2024   DTaP/Tdap/Td (2 - Td or Tdap) 11/14/2027   COLONOSCOPY (Pts 45-35yrs Insurance coverage will need to be confirmed)  01/04/2029   HIV Screening  Completed   Zoster Vaccines- Shingrix  Completed   HPV VACCINES  Aged Out       Assessment  This is a routine wellness examination for Dale Terrell.  Health Maintenance: Due or Overdue Health Maintenance Due  Topic Date Due    Medicare Annual Wellness (AWV)  11/23/2022   OPHTHALMOLOGY EXAM  03/05/2023    Dale Terrell does not need a referral for Community Assistance: Care Management:   no Social Work:    no Prescription Assistance:  no Nutrition/Diabetes Education:  no   Plan:  Personalized Goals  Goals Addressed             This Visit's Progress    DIET - REDUCE SUGAR INTAKE         Personalized Health Maintenance & Screening Recommendations  Advanced directives: has NO advanced directive - not interested in additional information  Lung Cancer Screening Recommended: no (Low Dose CT Chest recommended if Age 31-80 years, 30 pack-year currently smoking OR have quit w/in past 15 years) Hepatitis C Screening recommended: no HIV Screening recommended: no  Advanced Directives: Written information was not prepared per patient's request.  Referrals & Orders No orders of the defined types were placed in this encounter.   Follow-up Plan Follow-up with Gabriel Earing, FNP as planned Requested Diabetic Eye Exam from [facility] in [place]   I have personally reviewed and noted the following in the patient's chart:   Medical and social history Use of alcohol, tobacco or  illicit drugs  Current medications and supplements Functional ability and status Nutritional status Physical activity Advanced directives List of other physicians Hospitalizations, surgeries, and ER visits in previous 12 months Vitals Screenings to include cognitive, depression, and falls Referrals and appointments  In addition, I have reviewed and discussed with Dale Terrell certain preventive protocols, quality metrics, and best practice recommendations. A written personalized care plan for preventive services as well as general preventive health recommendations is available and can be mailed to the patient at his request.      Hessie Diener, LPN  [id number]/[id number]

## 2023-03-20 NOTE — Patient Instructions (Signed)

## 2023-03-26 ENCOUNTER — Other Ambulatory Visit: Payer: Self-pay | Admitting: Family Medicine

## 2023-03-26 DIAGNOSIS — K219 Gastro-esophageal reflux disease without esophagitis: Secondary | ICD-10-CM

## 2023-05-07 ENCOUNTER — Other Ambulatory Visit: Payer: Self-pay | Admitting: Family Medicine

## 2023-06-16 ENCOUNTER — Ambulatory Visit (INDEPENDENT_AMBULATORY_CARE_PROVIDER_SITE_OTHER): Payer: 59 | Admitting: Family Medicine

## 2023-06-16 ENCOUNTER — Encounter: Payer: Self-pay | Admitting: Family Medicine

## 2023-06-16 VITALS — BP 102/61 | HR 91 | Temp 98.1°F | Ht 67.0 in | Wt 191.2 lb

## 2023-06-16 DIAGNOSIS — E1159 Type 2 diabetes mellitus with other circulatory complications: Secondary | ICD-10-CM

## 2023-06-16 DIAGNOSIS — E1165 Type 2 diabetes mellitus with hyperglycemia: Secondary | ICD-10-CM

## 2023-06-16 DIAGNOSIS — I152 Hypertension secondary to endocrine disorders: Secondary | ICD-10-CM | POA: Diagnosis not present

## 2023-06-16 DIAGNOSIS — Z6830 Body mass index (BMI) 30.0-30.9, adult: Secondary | ICD-10-CM

## 2023-06-16 DIAGNOSIS — Z7984 Long term (current) use of oral hypoglycemic drugs: Secondary | ICD-10-CM | POA: Diagnosis not present

## 2023-06-16 DIAGNOSIS — K219 Gastro-esophageal reflux disease without esophagitis: Secondary | ICD-10-CM | POA: Diagnosis not present

## 2023-06-16 DIAGNOSIS — E785 Hyperlipidemia, unspecified: Secondary | ICD-10-CM

## 2023-06-16 DIAGNOSIS — E6609 Other obesity due to excess calories: Secondary | ICD-10-CM

## 2023-06-16 DIAGNOSIS — E1169 Type 2 diabetes mellitus with other specified complication: Secondary | ICD-10-CM

## 2023-06-16 DIAGNOSIS — F339 Major depressive disorder, recurrent, unspecified: Secondary | ICD-10-CM

## 2023-06-16 LAB — BAYER DCA HB A1C WAIVED: HB A1C (BAYER DCA - WAIVED): 5.9 % — ABNORMAL HIGH (ref 4.8–5.6)

## 2023-06-16 MED ORDER — METFORMIN HCL 1000 MG PO TABS
1000.0000 mg | ORAL_TABLET | Freq: Two times a day (BID) | ORAL | 3 refills | Status: DC
Start: 2023-06-16 — End: 2024-07-26

## 2023-06-16 MED ORDER — GLIMEPIRIDE 4 MG PO TABS
ORAL_TABLET | ORAL | 3 refills | Status: DC
Start: 2023-06-16 — End: 2024-07-26

## 2023-06-16 MED ORDER — LISINOPRIL 5 MG PO TABS
ORAL_TABLET | ORAL | 3 refills | Status: DC
Start: 2023-06-16 — End: 2024-06-08

## 2023-06-16 MED ORDER — EMPAGLIFLOZIN 10 MG PO TABS: 10.0000 mg | ORAL_TABLET | Freq: Every day | ORAL | 3 refills | Status: DC

## 2023-06-16 MED ORDER — FAMOTIDINE 20 MG PO TABS
ORAL_TABLET | ORAL | 3 refills | Status: DC
Start: 2023-06-16 — End: 2024-06-08

## 2023-06-16 NOTE — Progress Notes (Signed)
Established Patient Office Visit  Subjective   Patient ID: Dale Terrell, male    DOB: April 14, 1966  Age: 57 y.o. MRN: [id number]  Chief Complaint  Patient presents with   Medical Management of Chronic Issues   Diabetes    HPI  DM Patient denies foot ulcerations, increased appetite, nausea, paresthesia of the feet, polydipsia, polyuria, visual disturbances, vomiting, and weight loss.  Current diabetic medications include metformin, glimepiride, jardiance Compliant with meds - Yes  Current monitoring regimen: home blood tests - 2 times daily Home blood sugar records:  130-140s postprandial Any episodes of hypoglycemia? no  Eye exam current (within one year): yes Weight trend: stable Current diet: in general, a "healthy" diet   Eats smaller portions Current exercise: walks the neighborhood daily if it isn't too hot  Is He on ACE inhibitor or angiotensin II receptor blocker?  Yes, lisinopril Is He on statin? Yes simvastatin Is He on ASA 81 mg daily?  Yes  He sees podiatry in [place], has appt tomorrow.   2. HTN Complaint with meds - Yes Current Medications - lisinopril Pertinent ROS:  Headache - No Fatigue - No Visual Disturbances - No Dyspnea - No Palpitations - No LE edema - No Chest pain -  No  3. HLD On simvastatin. Last LDL was 71. Denies myalgias.   4. Depression/anxiety  He takes zoloft daily. Reports well controlled.   5. GERD Compliant with medications - Yes Current medications - pepcid BID Cough - No Sore throat - No Voice change - No Hemoptysis - No Dysphagia or dyspepsia - No Water brash - No Red Flags (weight loss, hematochezia, melena, weight loss, early satiety, fevers, odynophagia, or persistent vomiting) - No  6. Depression Well controlled with zoloft.      06/16/2023   10:59 AM 03/20/2023    8:32 AM 03/14/2023   10:48 AM  Depression screen PHQ 2/9  Decreased Interest 0 0 0  Down, Depressed, Hopeless 1 0 0  PHQ - 2 Score 1 0 0   Altered sleeping 0  0  Tired, decreased energy 0  1  Change in appetite 0  0  Feeling bad or failure about yourself  0  0  Trouble concentrating 0  0  Moving slowly or fidgety/restless 0  0  Suicidal thoughts 0  0  PHQ-9 Score 1  1  Difficult doing work/chores Not difficult at all  Not difficult at all      06/16/2023   10:59 AM 03/14/2023   10:48 AM 12/12/2022   10:49 AM 09/06/2022    9:30 AM  GAD 7 : Generalized Anxiety Score  Nervous, Anxious, on Edge 1 1 0 3  Control/stop worrying 0 0 0 0  Worry too much - different things 0 1 0 0  Trouble relaxing 0 0 0 0  Restless 0 0 0 0  Easily annoyed or irritable 0 0 0 0  Afraid - awful might happen 0 0 0 0  Total GAD 7 Score 1 2 0 3  Anxiety Difficulty Not difficult at all Not difficult at all Not difficult at all Not difficult at all     ROS As per HPI.    Objective:     BP 102/61   Pulse 91   Temp 98.1 F (36.7 C) (Temporal)   Ht 5\' 7"  (1.702 m)   Wt 191 lb 4 oz (86.8 kg)   SpO2 98%   BMI 29.95 kg/m  BP Readings from Last 3  Encounters:  06/16/23 102/61  03/14/23 112/67  12/12/22 120/60   Wt Readings from Last 3 Encounters:  06/16/23 191 lb 4 oz (86.8 kg)  03/14/23 193 lb 9.6 oz (87.8 kg)  12/12/22 194 lb 4 oz (88.1 kg)     Physical Exam Vitals and nursing note reviewed.  Constitutional:      General: He is not in acute distress.    Appearance: He is not ill-appearing, toxic-appearing or diaphoretic.  HENT:     Head: Normocephalic and atraumatic.  Neck:     Thyroid: No thyroid mass, thyromegaly or thyroid tenderness.     Vascular: No carotid bruit.  Cardiovascular:     Rate and Rhythm: Normal rate and regular rhythm.     Heart sounds: Normal heart sounds. No murmur heard. Pulmonary:     Effort: Pulmonary effort is normal. No respiratory distress.     Breath sounds: Normal breath sounds.  Abdominal:     General: Bowel sounds are normal. There is no distension.     Palpations: Abdomen is soft.      Tenderness: There is no abdominal tenderness. There is no guarding or rebound.  Musculoskeletal:     Cervical back: Neck supple. No rigidity.     Right lower leg: No edema.     Left lower leg: No swelling, deformity, tenderness or bony tenderness. No edema.  Skin:    General: Skin is warm and dry.  Neurological:     General: No focal deficit present.     Mental Status: He is alert and oriented to person, place, and time.  Psychiatric:        Mood and Affect: Mood normal.        Behavior: Behavior normal.        Thought Content: Thought content normal.        Judgment: Judgment normal.        No results found for any visits on 06/16/23.  Last CBC Lab Results  Component Value Date   WBC 8.7 09/06/2022   HGB 14.5 09/06/2022   HCT 43.4 09/06/2022   MCV 90 09/06/2022   MCH 30.0 09/06/2022   RDW 12.6 09/06/2022   PLT 231 09/06/2022   Last metabolic panel Lab Results  Component Value Date   GLUCOSE 83 09/06/2022   NA 141 09/06/2022   K 4.8 09/06/2022   CL 102 09/06/2022   CO2 23 09/06/2022   BUN 11 09/06/2022   CREATININE 0.84 09/06/2022   EGFR 102 09/06/2022   CALCIUM 9.6 09/06/2022   PHOS 2.7 11/10/2019   PROT 7.6 09/06/2022   ALBUMIN 4.6 09/06/2022   LABGLOB 3.0 09/06/2022   AGRATIO 1.5 09/06/2022   BILITOT 0.4 09/06/2022   ALKPHOS 45 09/06/2022   AST 37 09/06/2022   ALT 32 09/06/2022   ANIONGAP 6 07/02/2022   Last lipids Lab Results  Component Value Date   CHOL 121 09/06/2022   HDL 31 (L) 09/06/2022   LDLCALC 71 09/06/2022   TRIG 97 09/06/2022   CHOLHDL 3.9 09/06/2022   Last hemoglobin A1c Lab Results  Component Value Date   HGBA1C 6.1 (H) 03/14/2023      The ASCVD Risk score (Arnett DK, et al., 2019) failed to calculate for the following reasons:   The valid total cholesterol range is 130 to 320 mg/dL    Assessment & Plan:   Dale Terrell was seen today for medical management of chronic issues and diabetes.  Diagnoses and all orders for this  visit:  Type 2 diabetes mellitus with hyperglycemia, without long-term current use of insulin (HCC) A1c 5.9 today, at goal of <7. Medication changes today: none. Continue current regimen. He is on an ACE/ARB and statin. Eye exam: UTD. Foot exam: UTD, has podiatry appt tomorrow. Urine micro: UTD. Diet and exercise.  -     Bayer DCA Hb A1c Waived -     empagliflozin (JARDIANCE) 10 MG TABS tablet; Take 1 tablet (10 mg total) by mouth daily before breakfast. -     metFORMIN (GLUCOPHAGE) 1000 MG tablet; Take 1 tablet (1,000 mg total) by mouth 2 (two) times daily with a meal. -     glimepiride (AMARYL) 4 MG tablet; TAKE 2 TABLETS BY MOUTH 30 MINUTES BEFORE BREAKFAST DAILY. -     CMP14+EGFR  Long term current use of oral hypoglycemic drug  Hyperlipidemia associated with type 2 diabetes mellitus (HCC) Fasting panel pending.  -     lisinopril (ZESTRIL) 5 MG tablet; TAKE (1) TABLET BY MOUTH ONCE DAILY. -     Lipid panel  Hypertension associated with diabetes (HCC) Well controlled on current regimen.  -     CMP14+EGFR -     TSH -     CBC with Differential/Platelet  Gastroesophageal reflux disease, unspecified whether esophagitis present Well controlled on current regimen.  -     famotidine (PEPCID) 20 MG tablet; TAKE (1) TABLET BY MOUTH 2 TIMES DAILY.  Class 1 obesity due to excess calories with serious comorbidity and body mass index (BMI) of 30.0 to 30.9 in adult Trending down. Diet and exercise.   Depression, recurrent (HCC) Well controlled on current regimen.    Return in about 6 months (around 12/17/2023) for chronic follow up.   The patient indicates understanding of these issues and agrees with the plan.  Gabriel Earing, FNP

## 2023-06-17 DIAGNOSIS — M79672 Pain in left foot: Secondary | ICD-10-CM | POA: Diagnosis not present

## 2023-06-17 DIAGNOSIS — I739 Peripheral vascular disease, unspecified: Secondary | ICD-10-CM | POA: Diagnosis not present

## 2023-06-17 DIAGNOSIS — E1140 Type 2 diabetes mellitus with diabetic neuropathy, unspecified: Secondary | ICD-10-CM | POA: Diagnosis not present

## 2023-06-17 DIAGNOSIS — M79675 Pain in left toe(s): Secondary | ICD-10-CM | POA: Diagnosis not present

## 2023-06-17 DIAGNOSIS — M79671 Pain in right foot: Secondary | ICD-10-CM | POA: Diagnosis not present

## 2023-06-17 DIAGNOSIS — M79674 Pain in right toe(s): Secondary | ICD-10-CM | POA: Diagnosis not present

## 2023-06-17 LAB — CBC WITH DIFFERENTIAL/PLATELET
Basophils Absolute: 0.1 10*3/uL (ref 0.0–0.2)
Basos: 1 %
EOS (ABSOLUTE): 0.1 10*3/uL (ref 0.0–0.4)
Eos: 1 %
Hematocrit: 46.8 % (ref 37.5–51.0)
Hemoglobin: 15.7 g/dL (ref 13.0–17.7)
Immature Grans (Abs): 0 10*3/uL (ref 0.0–0.1)
Immature Granulocytes: 0 %
Lymphocytes Absolute: 2.3 10*3/uL (ref 0.7–3.1)
Lymphs: 23 %
MCH: 29.6 pg (ref 26.6–33.0)
MCHC: 33.5 g/dL (ref 31.5–35.7)
MCV: 88 fL (ref 79–97)
Monocytes Absolute: 0.8 10*3/uL (ref 0.1–0.9)
Monocytes: 8 %
Neutrophils Absolute: 6.6 10*3/uL (ref 1.4–7.0)
Neutrophils: 67 %
Platelets: 263 10*3/uL (ref 150–450)
RBC: 5.31 x10E6/uL (ref 4.14–5.80)
RDW: 13.3 % (ref 11.6–15.4)
WBC: 9.8 10*3/uL (ref 3.4–10.8)

## 2023-06-17 LAB — LIPID PANEL
Chol/HDL Ratio: 3.8 ratio (ref 0.0–5.0)
Cholesterol, Total: 123 mg/dL (ref 100–199)
HDL: 32 mg/dL — ABNORMAL LOW (ref >39)
LDL Chol Calc (NIH): 72 mg/dL (ref 0–99)
Triglycerides: 103 mg/dL (ref 0–149)
VLDL Cholesterol Cal: 19 mg/dL (ref 5–40)

## 2023-06-17 LAB — CMP14+EGFR
ALT: 31 IU/L (ref 0–44)
AST: 32 IU/L (ref 0–40)
Albumin: 4.5 g/dL (ref 3.8–4.9)
Alkaline Phosphatase: 48 IU/L (ref 44–121)
BUN/Creatinine Ratio: 15 (ref 9–20)
BUN: 13 mg/dL (ref 6–24)
Bilirubin Total: 0.3 mg/dL (ref 0.0–1.2)
CO2: 21 mmol/L (ref 20–29)
Calcium: 9.8 mg/dL (ref 8.7–10.2)
Chloride: 98 mmol/L (ref 96–106)
Creatinine, Ser: 0.88 mg/dL (ref 0.76–1.27)
Globulin, Total: 3.4 g/dL (ref 1.5–4.5)
Glucose: 56 mg/dL — ABNORMAL LOW (ref 70–99)
Potassium: 4.9 mmol/L (ref 3.5–5.2)
Sodium: 137 mmol/L (ref 134–144)
Total Protein: 7.9 g/dL (ref 6.0–8.5)
eGFR: 100 mL/min/{1.73_m2} (ref >59)

## 2023-06-17 LAB — TSH: TSH: 2.54 u[IU]/mL (ref 0.450–4.500)

## 2023-07-22 ENCOUNTER — Other Ambulatory Visit: Payer: Self-pay | Admitting: Family Medicine

## 2023-07-22 DIAGNOSIS — M62838 Other muscle spasm: Secondary | ICD-10-CM

## 2023-09-16 DIAGNOSIS — M79672 Pain in left foot: Secondary | ICD-10-CM | POA: Diagnosis not present

## 2023-09-16 DIAGNOSIS — M79675 Pain in left toe(s): Secondary | ICD-10-CM | POA: Diagnosis not present

## 2023-09-16 DIAGNOSIS — M79671 Pain in right foot: Secondary | ICD-10-CM | POA: Diagnosis not present

## 2023-09-16 DIAGNOSIS — M79674 Pain in right toe(s): Secondary | ICD-10-CM | POA: Diagnosis not present

## 2023-09-16 DIAGNOSIS — I739 Peripheral vascular disease, unspecified: Secondary | ICD-10-CM | POA: Diagnosis not present

## 2023-09-16 DIAGNOSIS — E1140 Type 2 diabetes mellitus with diabetic neuropathy, unspecified: Secondary | ICD-10-CM | POA: Diagnosis not present

## 2023-12-16 DIAGNOSIS — M79675 Pain in left toe(s): Secondary | ICD-10-CM | POA: Diagnosis not present

## 2023-12-16 DIAGNOSIS — M79671 Pain in right foot: Secondary | ICD-10-CM | POA: Diagnosis not present

## 2023-12-16 DIAGNOSIS — M79674 Pain in right toe(s): Secondary | ICD-10-CM | POA: Diagnosis not present

## 2023-12-16 DIAGNOSIS — M79672 Pain in left foot: Secondary | ICD-10-CM | POA: Diagnosis not present

## 2023-12-16 DIAGNOSIS — E1140 Type 2 diabetes mellitus with diabetic neuropathy, unspecified: Secondary | ICD-10-CM | POA: Diagnosis not present

## 2023-12-16 DIAGNOSIS — I739 Peripheral vascular disease, unspecified: Secondary | ICD-10-CM | POA: Diagnosis not present

## 2023-12-18 ENCOUNTER — Ambulatory Visit: Payer: 59 | Admitting: Family Medicine

## 2024-01-01 ENCOUNTER — Encounter: Payer: Self-pay | Admitting: Family Medicine

## 2024-01-01 ENCOUNTER — Ambulatory Visit: Payer: 59 | Admitting: Family Medicine

## 2024-01-01 VITALS — BP 109/59 | HR 90 | Temp 97.9°F | Ht 67.0 in | Wt 193.0 lb

## 2024-01-01 DIAGNOSIS — E66811 Obesity, class 1: Secondary | ICD-10-CM

## 2024-01-01 DIAGNOSIS — E1159 Type 2 diabetes mellitus with other circulatory complications: Secondary | ICD-10-CM | POA: Diagnosis not present

## 2024-01-01 DIAGNOSIS — E785 Hyperlipidemia, unspecified: Secondary | ICD-10-CM | POA: Diagnosis not present

## 2024-01-01 DIAGNOSIS — Z6830 Body mass index (BMI) 30.0-30.9, adult: Secondary | ICD-10-CM

## 2024-01-01 DIAGNOSIS — Z7984 Long term (current) use of oral hypoglycemic drugs: Secondary | ICD-10-CM

## 2024-01-01 DIAGNOSIS — E1169 Type 2 diabetes mellitus with other specified complication: Secondary | ICD-10-CM | POA: Diagnosis not present

## 2024-01-01 DIAGNOSIS — Z23 Encounter for immunization: Secondary | ICD-10-CM

## 2024-01-01 DIAGNOSIS — E538 Deficiency of other specified B group vitamins: Secondary | ICD-10-CM

## 2024-01-01 DIAGNOSIS — F339 Major depressive disorder, recurrent, unspecified: Secondary | ICD-10-CM

## 2024-01-01 DIAGNOSIS — I152 Hypertension secondary to endocrine disorders: Secondary | ICD-10-CM | POA: Diagnosis not present

## 2024-01-01 DIAGNOSIS — E1165 Type 2 diabetes mellitus with hyperglycemia: Secondary | ICD-10-CM

## 2024-01-01 LAB — BAYER DCA HB A1C WAIVED: HB A1C (BAYER DCA - WAIVED): 6 % — ABNORMAL HIGH (ref 4.8–5.6)

## 2024-01-01 NOTE — Assessment & Plan Note (Signed)
 Well controlled with zoloft

## 2024-01-01 NOTE — Assessment & Plan Note (Signed)
 Diet, exercise, weight loss.

## 2024-01-01 NOTE — Progress Notes (Signed)
 Established Patient Office Visit  Subjective   Patient ID: Dale Terrell, male    DOB: December 08, 1965  Age: 58 y.o. MRN: [id number]  Chief Complaint  Patient presents with   Medical Management of Chronic Issues    HPI  DM Patient denies foot ulcerations, increased appetite, nausea, paresthesia of the feet, polydipsia, polyuria, visual disturbances, vomiting, and weight loss.  Current diabetic medications include metformin, glimepiride, jardiance Compliant with meds - Yes  Current monitoring regimen: home blood tests - 2 times daily Home blood sugar records:  fasting: 120s, 130-140s postprandial Any episodes of hypoglycemia? no  Eye exam current (within one year): yes Weight trend: stable Current diet: in general, a "healthy" diet    Current exercise: occasionally  Is He on ACE inhibitor or angiotensin II receptor blocker?  Yes, lisinopril Is He on statin? Yes simvastatin Is He on ASA 81 mg daily?  Yes  He sees podiatry in [place].   2. HTN Complaint with meds - Yes Current Medications - lisinopril Pertinent ROS:  Headache - No Fatigue - No Visual Disturbances - No Dyspnea - No Palpitations - No LE edema - No Chest pain -  No  3. HLD On simvastatin. Last LDL was 72. Denies myalgias.   4. Depression/anxiety  He takes zoloft daily. Reports well controlled.   5. GERD Compliant with medications - Yes Current medications - pepcid BID Cough - No Sore throat - No Voice change - No Hemoptysis - No Dysphagia or dyspepsia - No Water brash - No Red Flags (weight loss, hematochezia, melena, weight loss, early satiety, fevers, odynophagia, or persistent vomiting) - No  6. Depression Well controlled with zoloft.      01/01/2024   11:04 AM 06/16/2023   10:59 AM 03/20/2023    8:32 AM  Depression screen PHQ 2/9  Decreased Interest 0 0 0  Down, Depressed, Hopeless 0 1 0  PHQ - 2 Score 0 1 0  Altered sleeping 0 0   Tired, decreased energy 0 0   Change in appetite 0 0    Feeling bad or failure about yourself  0 0   Trouble concentrating 0 0   Moving slowly or fidgety/restless 0 0   Suicidal thoughts 0 0   PHQ-9 Score 0 1   Difficult doing work/chores Not difficult at all Not difficult at all       01/01/2024   11:04 AM 06/16/2023   10:59 AM 03/14/2023   10:48 AM 12/12/2022   10:49 AM  GAD 7 : Generalized Anxiety Score  Nervous, Anxious, on Edge 0 1 1 0  Control/stop worrying 0 0 0 0  Worry too much - different things 0 0 1 0  Trouble relaxing 0 0 0 0  Restless 0 0 0 0  Easily annoyed or irritable 0 0 0 0  Afraid - awful might happen 0 0 0 0  Total GAD 7 Score 0 1 2 0  Anxiety Difficulty Not difficult at all Not difficult at all Not difficult at all Not difficult at all     ROS As per HPI.    Objective:     BP (!) 109/59   Pulse 90   Temp 97.9 F (36.6 C) (Temporal)   Ht 5\' 7"  (1.702 m)   Wt 193 lb (87.5 kg)   SpO2 98%   BMI 30.23 kg/m  BP Readings from Last 3 Encounters:  01/01/24 (!) 109/59  06/16/23 102/61  03/14/23 112/67   Wt Readings from  Last 3 Encounters:  01/01/24 193 lb (87.5 kg)  06/16/23 191 lb 4 oz (86.8 kg)  03/14/23 193 lb 9.6 oz (87.8 kg)     Physical Exam Vitals and nursing note reviewed.  Constitutional:      General: He is not in acute distress.    Appearance: He is not ill-appearing, toxic-appearing or diaphoretic.  HENT:     Head: Normocephalic and atraumatic.  Neck:     Thyroid: No thyroid mass, thyromegaly or thyroid tenderness.     Vascular: No carotid bruit.  Cardiovascular:     Rate and Rhythm: Normal rate and regular rhythm.     Heart sounds: Normal heart sounds. No murmur heard. Pulmonary:     Effort: Pulmonary effort is normal. No respiratory distress.     Breath sounds: Normal breath sounds.  Abdominal:     General: Bowel sounds are normal. There is no distension.     Palpations: Abdomen is soft.     Tenderness: There is no abdominal tenderness. There is no guarding or rebound.   Musculoskeletal:     Cervical back: Neck supple. No rigidity.     Right lower leg: No edema.     Left lower leg: No swelling, deformity, tenderness or bony tenderness. No edema.  Skin:    General: Skin is warm and dry.  Neurological:     General: No focal deficit present.     Mental Status: He is alert and oriented to person, place, and time.  Psychiatric:        Mood and Affect: Mood normal.        Behavior: Behavior normal.        Thought Content: Thought content normal.        Judgment: Judgment normal.     Diabetic Foot Exam - Simple   Simple Foot Form Diabetic Foot exam was performed with the following findings: Yes 01/01/2024 11:36 AM  Visual Inspection Sensation Testing Intact to touch and monofilament testing bilaterally: Yes Pulse Check Posterior Tibialis and Dorsalis pulse intact bilaterally: Yes Comments Callus bilaterally, no skin breakdown.        No results found for any visits on 01/01/24.  Last CBC Lab Results  Component Value Date   WBC 9.8 06/16/2023   HGB 15.7 06/16/2023   HCT 46.8 06/16/2023   MCV 88 06/16/2023   MCH 29.6 06/16/2023   RDW 13.3 06/16/2023   PLT 263 06/16/2023   Last metabolic panel Lab Results  Component Value Date   GLUCOSE 56 (L) 06/16/2023   NA 137 06/16/2023   K 4.9 06/16/2023   CL 98 06/16/2023   CO2 21 06/16/2023   BUN 13 06/16/2023   CREATININE 0.88 06/16/2023   EGFR 100 06/16/2023   CALCIUM 9.8 06/16/2023   PHOS 2.7 11/10/2019   PROT 7.9 06/16/2023   ALBUMIN 4.5 06/16/2023   LABGLOB 3.4 06/16/2023   AGRATIO 1.5 09/06/2022   BILITOT 0.3 06/16/2023   ALKPHOS 48 06/16/2023   AST 32 06/16/2023   ALT 31 06/16/2023   ANIONGAP 6 07/02/2022   Last lipids Lab Results  Component Value Date   CHOL 123 06/16/2023   HDL 32 (L) 06/16/2023   LDLCALC 72 06/16/2023   TRIG 103 06/16/2023   CHOLHDL 3.8 06/16/2023   Last hemoglobin A1c Lab Results  Component Value Date   HGBA1C 5.9 (H) 06/16/2023      The  ASCVD Risk score (Arnett DK, et al., 2019) failed to calculate for the following reasons:  The valid total cholesterol range is 130 to 320 mg/dL    Assessment & Plan:   Type 2 diabetes mellitus with hyperglycemia, without long-term current use of insulin (HCC) Assessment & Plan: A1c 6.0 today, at goal of <7. Continue jardiance, glimepiride, metformin. He is on an ACE/ARB and statin. Diet and exercise.     Orders: -     Bayer DCA Hb A1c Waived  Long term current use of oral hypoglycemic drug  Hypertension associated with diabetes (HCC) Assessment & Plan: Well controlled with lisinopril.    Hyperlipidemia associated with type 2 diabetes mellitus (HCC) Assessment & Plan: On statin. Last LDL 72.   Class 1 obesity due to excess calories with serious comorbidity and body mass index (BMI) of 30.0 to 30.9 in adult Assessment & Plan: Diet, exercise, weight loss.    Vitamin B12 deficiency -     Vitamin B12  Depression, recurrent (HCC) Assessment & Plan: Well controlled with zoloft.    Encounter for immunization -     Flu vaccine trivalent PF, 6mos and older(Flulaval,Afluria,Fluarix,Fluzone)     Return in about 6 months (around 07/03/2024) for CPE with fasting labs.   The patient indicates understanding of these issues and agrees with the plan.  Gabriel Earing, FNP

## 2024-01-01 NOTE — Assessment & Plan Note (Signed)
 Well controlled with lisinopril.

## 2024-01-01 NOTE — Assessment & Plan Note (Signed)
 On statin. Last LDL 72.

## 2024-01-01 NOTE — Assessment & Plan Note (Signed)
 A1c 6.0 today, at goal of <7. Continue jardiance, glimepiride, metformin. He is on an ACE/ARB and statin. Diet and exercise.

## 2024-01-02 LAB — VITAMIN B12: Vitamin B-12: 329 pg/mL (ref 232–1245)

## 2024-03-08 DIAGNOSIS — E119 Type 2 diabetes mellitus without complications: Secondary | ICD-10-CM | POA: Diagnosis not present

## 2024-03-16 DIAGNOSIS — M79671 Pain in right foot: Secondary | ICD-10-CM | POA: Diagnosis not present

## 2024-03-16 DIAGNOSIS — M79674 Pain in right toe(s): Secondary | ICD-10-CM | POA: Diagnosis not present

## 2024-03-16 DIAGNOSIS — I739 Peripheral vascular disease, unspecified: Secondary | ICD-10-CM | POA: Diagnosis not present

## 2024-03-16 DIAGNOSIS — M79675 Pain in left toe(s): Secondary | ICD-10-CM | POA: Diagnosis not present

## 2024-03-16 DIAGNOSIS — M79672 Pain in left foot: Secondary | ICD-10-CM | POA: Diagnosis not present

## 2024-03-16 DIAGNOSIS — E1140 Type 2 diabetes mellitus with diabetic neuropathy, unspecified: Secondary | ICD-10-CM | POA: Diagnosis not present

## 2024-03-23 ENCOUNTER — Other Ambulatory Visit: Payer: Self-pay | Admitting: *Deleted

## 2024-03-23 DIAGNOSIS — E1169 Type 2 diabetes mellitus with other specified complication: Secondary | ICD-10-CM

## 2024-03-23 MED ORDER — SIMVASTATIN 40 MG PO TABS
40.0000 mg | ORAL_TABLET | Freq: Every day | ORAL | 0 refills | Status: DC
Start: 2024-03-23 — End: 2024-06-29

## 2024-03-29 ENCOUNTER — Ambulatory Visit (INDEPENDENT_AMBULATORY_CARE_PROVIDER_SITE_OTHER): Payer: 59

## 2024-03-29 VITALS — BP 109/59 | HR 90 | Ht 67.0 in | Wt 193.1 lb

## 2024-03-29 DIAGNOSIS — Z0000 Encounter for general adult medical examination without abnormal findings: Secondary | ICD-10-CM

## 2024-03-29 NOTE — Patient Instructions (Signed)
 Dale Terrell , Thank you for taking time out of your busy schedule to complete your Annual Wellness Visit with me. I enjoyed our conversation and look forward to speaking with you again next year. I, as well as your care team,  appreciate your ongoing commitment to your health goals. Please review the following plan we discussed and let me know if I can assist you in the future. Your Game plan/ To Do List   Follow up Visits: Next Medicare AWV with our clinical staff: 03/30/25 at 8:00a.m.   Have you seen your provider in the last 6 months (3 months if uncontrolled diabetes)? Yes Next Office Visit with your provider: 07/12/24 at 11:15a.m.  Clinician Recommendations:  Aim for 30 minutes of exercise or brisk walking, 6-8 glasses of water, and 5 servings of fruits and vegetables each day.       This is a list of the screening recommended for you and due dates:  Health Maintenance  Topic Date Due   Yearly kidney health urinalysis for diabetes  03/13/2024   Eye exam for diabetics  03/05/2024   Pneumococcal Vaccination (2 of 2 - PCV) 12/31/2024*   Hepatitis C Screening  12/31/2024*   COVID-19 Vaccine (3 - Moderna risk series) 01/16/2025*   Flu Shot  05/28/2024   Yearly kidney function blood test for diabetes  06/15/2024   Hemoglobin A1C  07/03/2024   Complete foot exam   12/31/2024   Medicare Annual Wellness Visit  03/29/2025   DTaP/Tdap/Td vaccine (2 - Td or Tdap) 11/14/2027   Colon Cancer Screening  01/04/2029   HIV Screening  Completed   Zoster (Shingles) Vaccine  Completed   HPV Vaccine  Aged Out   Meningitis B Vaccine  Aged Out  *Topic was postponed. The date shown is not the original due date.    Advanced directives: (Declined) Advance directive discussed with you today. Even though you declined this today, please call our office should you change your mind, and we can give you the proper paperwork for you to fill out. Advance Care Planning is important because it:  [x]  Makes sure you  receive the medical care that is consistent with your values, goals, and preferences  [x]  It provides guidance to your family and loved ones and reduces their decisional burden about whether or not they are making the right decisions based on your wishes.  Follow the link provided in your after visit summary or read over the paperwork we have mailed to you to help you started getting your Advance Directives in place. If you need assistance in completing these, please reach out to us  so that we can help you!  See attachments for Preventive Care and Fall Prevention Tips.

## 2024-03-29 NOTE — Progress Notes (Signed)
 Subjective:   REG BIRCHER is a 58 y.o. who presents for a Medicare Wellness preventive visit.  As a reminder, Annual Wellness Visits don't include a physical exam, and some assessments may be limited, especially if this visit is performed virtually. We may recommend an in-person follow-up visit with your provider if needed.  Visit Complete: Virtual I connected with  Edger Goody on 03/29/24 by a audio enabled telemedicine application and verified that I am speaking with the correct person using two identifiers.  Patient Location: Home  Provider Location: Home Office  I discussed the limitations of evaluation and management by telemedicine. The patient expressed understanding and agreed to proceed.  Vital Signs: Because this visit was a virtual/telehealth visit, some criteria may be missing or patient reported. Any vitals not documented were not able to be obtained and vitals that have been documented are patient reported.  VideoDeclined- This patient declined Librarian, academic. Therefore the visit was completed with audio only.  Persons Participating in Visit: Patient.  AWV Questionnaire: No: Patient Medicare AWV questionnaire was not completed prior to this visit.  Cardiac Risk Factors include: advanced age (>22men, >87 women);diabetes mellitus;male gender;dyslipidemia;hypertension;obesity (BMI >30kg/m2)     Objective:     Today's Vitals   03/29/24 0806  BP: (!) 109/59  Pulse: 90  Weight: 193 lb 1 oz (87.6 kg)  Height: 5\' 7"  (1.702 m)   Body mass index is 30.24 kg/m.     03/29/2024    8:12 AM 03/20/2023    8:31 AM 07/02/2022    5:30 PM 11/23/2021   12:04 PM 11/08/2019    9:17 AM 11/05/2019   10:00 AM 01/05/2019    9:49 AM  Advanced Directives  Does Patient Have a Medical Advance Directive? No No No No  No No  Would patient like information on creating a medical advance directive?  No - Patient declined  No - Patient declined No - Patient  declined No - Patient declined No - Patient declined    Current Medications (verified) Outpatient Encounter Medications as of 03/29/2024  Medication Sig   aspirin  EC 81 MG tablet Take 81 mg by mouth daily.   cyanocobalamin  (VITAMIN B12) 1000 MCG tablet Take 1,000 mcg by mouth daily.   empagliflozin  (JARDIANCE ) 10 MG TABS tablet Take 1 tablet (10 mg total) by mouth daily before breakfast.   famotidine  (PEPCID ) 20 MG tablet TAKE (1) TABLET BY MOUTH 2 TIMES DAILY.   glimepiride  (AMARYL ) 4 MG tablet TAKE 2 TABLETS BY MOUTH 30 MINUTES BEFORE BREAKFAST DAILY.   glucose blood (ONETOUCH ULTRA) test strip Test BS BID Dx E11.69   lisinopril  (ZESTRIL ) 5 MG tablet TAKE (1) TABLET BY MOUTH ONCE DAILY.   metFORMIN  (GLUCOPHAGE ) 1000 MG tablet Take 1 tablet (1,000 mg total) by mouth 2 (two) times daily with a meal.   sertraline  (ZOLOFT ) 100 MG tablet Take 1 tablet (100 mg total) by mouth daily.   simvastatin  (ZOCOR ) 40 MG tablet Take 1 tablet (40 mg total) by mouth at bedtime.   tiZANidine  (ZANAFLEX ) 4 MG tablet TAKE (1) TABLET BY MOUTH EVERY EIGHT HOURS AS NEEDED.   No facility-administered encounter medications on file as of 03/29/2024.    Allergies (verified) Bee venom and Ciprofloxacin    History: Past Medical History:  Diagnosis Date   Chronic back pain    Depression    on zoloft    Diabetes mellitus without complication (HCC)    Hyperlipidemia    Hypertension    Seizure (  HCC)    as a child, umknown etiology and on no meds ever, only had 1.   Past Surgical History:  Procedure Laterality Date   CHOLECYSTECTOMY N/A 11/08/2019   Procedure: ATTEMPTED LAPAROSCOPIC CHOLECYSTECTOMY; OPEN PARTIAL CHOLECYSTECTOMY;  Surgeon: Alanda Allegra, MD;  Location: [facility];  Service: General;  Laterality: N/A;  LAPAROTOMY AT 1156   COLONOSCOPY WITH PROPOFOL  N/A 01/05/2019   Procedure: COLONOSCOPY WITH PROPOFOL ;  Surgeon: Alyce Jubilee, MD;  Location: [facility];  Service: Endoscopy;  Laterality: N/A;  12:00pm    cyst on arm Right    ESOPHAGOGASTRODUODENOSCOPY (EGD) WITH PROPOFOL   01/05/2019   Procedure: ESOPHAGOGASTRODUODENOSCOPY (EGD) WITH PROPOFOL ;  Surgeon: Alyce Jubilee, MD;  Location: [facility];  Service: Endoscopy;;   POLYPECTOMY  01/05/2019   Procedure: POLYPECTOMY;  Surgeon: Alyce Jubilee, MD;  Location: [facility];  Service: Endoscopy;;   Family History  Problem Relation Age of Onset   Stroke Mother    Arthritis Mother    Heart attack Father        46"s   Hearing loss Maternal Grandmother    Colon cancer Neg Hx    Colon polyps Neg Hx    Social History   Socioeconomic History   Marital status: Single    Spouse name: Not on file   Number of children: 1   Years of education: 12   Highest education level: High school graduate  Occupational History   Occupation: disabled  Tobacco Use   Smoking status: Former    Current packs/day: 0.00    Average packs/day: 0.3 packs/day for 2.0 years (0.5 ttl pk-yrs)    Types: Cigarettes    Start date: 2019    Quit date: 2021    Years since quitting: 4.4   Smokeless tobacco: Never  Vaping Use   Vaping status: Never Used  Substance and Sexual Activity   Alcohol use: Not Currently   Drug use: No   Sexual activity: Not Currently    Birth control/protection: None  Other Topics Concern   Not on file  Social History Narrative   Lives with his aunt and brother   Social Drivers of Health   Financial Resource Strain: Low Risk  (03/29/2024)   Overall Financial Resource Strain (CARDIA)    Difficulty of Paying Living Expenses: Not hard at all  Food Insecurity: No Food Insecurity (03/29/2024)   Hunger Vital Sign    Worried About Running Out of Food in the Last Year: Never true    Ran Out of Food in the Last Year: Never true  Transportation Needs: No Transportation Needs (03/29/2024)   PRAPARE - Administrator, Civil Service (Medical): No    Lack of Transportation (Non-Medical): No  Physical Activity: Insufficiently Active  (03/29/2024)   Exercise Vital Sign    Days of Exercise per Week: 7 days    Minutes of Exercise per Session: 20 min  Stress: No Stress Concern Present (03/29/2024)   [employer] of Occupational Health - Occupational Stress Questionnaire    Feeling of Stress : Not at all  Social Connections: Socially Isolated (03/29/2024)   Social Connection and Isolation Panel [NHANES]    Frequency of Communication with Friends and Family: More than three times a week    Frequency of Social Gatherings with Friends and Family: More than three times a week    Attends Religious Services: Never    Database administrator or Organizations: No    Attends Banker  Meetings: Never    Marital Status: Never married    Tobacco Counseling Counseling given: Yes    Clinical Intake:  Pre-visit preparation completed: Yes  Pain : No/denies pain     BMI - recorded: 30.2 Nutritional Status: BMI > 30  Obese Nutritional Risks: None Diabetes: Yes CBG done?: Yes (132 this morning)  Lab Results  Component Value Date   HGBA1C 6.0 (H) 01/01/2024   HGBA1C 5.9 (H) 06/16/2023   HGBA1C 6.1 (H) 03/14/2023     How often do you need to have someone help you when you read instructions, pamphlets, or other written materials from your doctor or pharmacy?: 1 - Never  Interpreter Needed?: No  Information entered by :: Alia T/cma   Activities of Daily Living     03/29/2024    8:09 AM  In your present state of health, do you have any difficulty performing the following activities:  Hearing? 0  Vision? 0  Difficulty concentrating or making decisions? 0  Walking or climbing stairs? 0  Dressing or bathing? 0  Doing errands, shopping? 0  Preparing Food and eating ? N  Using the Toilet? N  In the past six months, have you accidently leaked urine? N  Do you have problems with loss of bowel control? N  Managing your Medications? N  Managing your Finances? N  Housekeeping or managing your Housekeeping? N     Patient Care Team: Albertha Huger, FNP as PCP - General (Family Medicine) Alyce Jubilee, MD (Inactive) as Consulting Physician (Gastroenterology) Lucendia Rusk, DO (Optometry)  I have updated your Care Teams any recent Medical Services you may have received from other providers in the past year.     Assessment:    This is a routine wellness examination for [place].  Hearing/Vision screen Hearing Screening - Comments:: Pt denies hearing dif Vision Screening - Comments:: Pt denies vision dif/pt goes to [facility] in [place], last visit 5/25 per pt   Goals Addressed             This Visit's Progress    DIET - REDUCE SUGAR INTAKE   On track    Exercise 150 min/wk Moderate Activity   On track      Depression Screen     03/29/2024    8:23 AM 01/01/2024   11:04 AM 06/16/2023   10:59 AM 03/20/2023    8:32 AM 03/14/2023   10:48 AM 12/12/2022   10:48 AM 09/06/2022    9:29 AM  PHQ 2/9 Scores  PHQ - 2 Score 0 0 1 0 0 0 1  PHQ- 9 Score  0 1  1 1      Fall Risk     03/29/2024    8:13 AM 01/01/2024   11:03 AM 06/16/2023   10:58 AM 03/20/2023    8:32 AM 03/14/2023   10:48 AM  Fall Risk   Falls in the past year? 0 0 0 0 0  Number falls in past yr: 0      Injury with Fall? 0      Risk for fall due to : No Fall Risks      Follow up Falls evaluation completed        MEDICARE RISK AT HOME:  Medicare Risk at Home Any stairs in or around the home?: Yes If so, are there any without handrails?: Yes Home free of loose throw rugs in walkways, pet beds, electrical cords, etc?: Yes Adequate lighting in your home to reduce risk  of falls?: Yes Life alert?: No Use of a cane, walker or w/c?: No Grab bars in the bathroom?: Yes Shower chair or bench in shower?: No Elevated toilet seat or a handicapped toilet?: No  TIMED UP AND GO:  Was the test performed?  no  Cognitive Function: 6CIT completed        03/29/2024    8:18 AM 03/20/2023    8:35 AM 11/23/2021   12:06 PM  6CIT Screen   What Year? 0 points 0 points 0 points  What month? 0 points 0 points 0 points  What time? 0 points 0 points 0 points  Count back from 20 0 points 0 points 0 points  Months in reverse 0 points 0 points 0 points  Repeat phrase 10 points 4 points 6 points  Total Score 10 points 4 points 6 points    Immunizations Immunization History  Administered Date(s) Administered   Influenza Inj Mdck Quad Pf 11/13/2017, 08/15/2021   Influenza Split 07/28/2014   Influenza, High Dose Seasonal PF 11/09/2019   Influenza, Seasonal, Injecte, Preservative Fre 01/01/2024   Influenza,inj,Quad PF,6+ Mos 11/09/2019, 08/03/2020, 09/06/2022   Influenza,inj,quad, With Preservative 08/03/2020   Influenza,trivalent, recombinat, inj, PF 07/28/2014   Influenza-Unspecified 08/03/2020   Moderna Sars-Covid-2 Vaccination 02/18/2020, 03/16/2020   Pneumococcal Polysaccharide-23 10/18/2014, 11/09/2019   Pneumococcal-Unspecified 11/09/2019   Tdap 11/13/2017   Zoster Recombinant(Shingrix ) 11/27/2021, 03/01/2022    Screening Tests Health Maintenance  Topic Date Due   Diabetic kidney evaluation - Urine ACR  03/13/2024   OPHTHALMOLOGY EXAM  03/05/2024   Pneumococcal Vaccine 35-18 Years old (2 of 2 - PCV) 12/31/2024 (Originally 11/08/2020)   Hepatitis C Screening  12/31/2024 (Originally 04/19/1984)   COVID-19 Vaccine (3 - Moderna risk series) 01/16/2025 (Originally 04/13/2020)   INFLUENZA VACCINE  05/28/2024   Diabetic kidney evaluation - eGFR measurement  06/15/2024   HEMOGLOBIN A1C  07/03/2024   FOOT EXAM  12/31/2024   Medicare Annual Wellness (AWV)  03/29/2025   DTaP/Tdap/Td (2 - Td or Tdap) 11/14/2027   Colonoscopy  01/04/2029   HIV Screening  Completed   Zoster Vaccines- Shingrix   Completed   HPV VACCINES  Aged Out   Meningococcal B Vaccine  Aged Out    Health Maintenance  Health Maintenance Due  Topic Date Due   Diabetic kidney evaluation - Urine ACR  03/13/2024   OPHTHALMOLOGY EXAM  03/05/2024   Health  Maintenance Items Addressed: See Nurse Notes at the end of this note  Additional Screening:  Vision Screening: Recommended annual ophthalmology exams for early detection of glaucoma and other disorders of the eye. Would you like a referral to an eye doctor? No    Dental Screening: Recommended annual dental exams for proper oral hygiene  Community Resource Referral / Chronic Care Management: CRR required this visit?  No   CCM required this visit?  No   Plan:    I have personally reviewed and noted the following in the patient's chart:   Medical and social history Use of alcohol, tobacco or illicit drugs  Current medications and supplements including opioid prescriptions. Patient is not currently taking opioid prescriptions. Functional ability and status Nutritional status Physical activity Advanced directives List of other physicians Hospitalizations, surgeries, and ER visits in previous 12 months Vitals Screenings to include cognitive, depression, and falls Referrals and appointments  In addition, I have reviewed and discussed with patient certain preventive protocols, quality metrics, and best practice recommendations. A written personalized care plan for preventive services as well as  general preventive health recommendations were provided to patient.   Michaelle Adolphus, CMA   03/29/2024   After Visit Summary: (Declined) Due to this being a telephonic visit, with patients personalized plan was offered to patient but patient Declined AVS at this time   Notes: Nothing significant to report at this time.

## 2024-04-12 ENCOUNTER — Encounter: Payer: Self-pay | Admitting: Family Medicine

## 2024-04-12 ENCOUNTER — Telehealth: Payer: Self-pay | Admitting: Family Medicine

## 2024-05-18 ENCOUNTER — Other Ambulatory Visit: Payer: Self-pay | Admitting: Family Medicine

## 2024-05-18 DIAGNOSIS — M62838 Other muscle spasm: Secondary | ICD-10-CM

## 2024-06-08 ENCOUNTER — Other Ambulatory Visit: Payer: Self-pay | Admitting: *Deleted

## 2024-06-08 DIAGNOSIS — K219 Gastro-esophageal reflux disease without esophagitis: Secondary | ICD-10-CM

## 2024-06-08 DIAGNOSIS — E1169 Type 2 diabetes mellitus with other specified complication: Secondary | ICD-10-CM

## 2024-06-08 MED ORDER — LISINOPRIL 5 MG PO TABS: ORAL_TABLET | ORAL | 0 refills | Status: DC

## 2024-06-08 MED ORDER — FAMOTIDINE 20 MG PO TABS: ORAL_TABLET | ORAL | 0 refills | Status: DC

## 2024-06-15 ENCOUNTER — Other Ambulatory Visit: Payer: Self-pay | Admitting: Family Medicine

## 2024-06-15 DIAGNOSIS — F339 Major depressive disorder, recurrent, unspecified: Secondary | ICD-10-CM

## 2024-06-29 ENCOUNTER — Other Ambulatory Visit: Payer: Self-pay | Admitting: Family Medicine

## 2024-06-29 DIAGNOSIS — E1169 Type 2 diabetes mellitus with other specified complication: Secondary | ICD-10-CM

## 2024-07-07 DIAGNOSIS — M79674 Pain in right toe(s): Secondary | ICD-10-CM | POA: Diagnosis not present

## 2024-07-07 DIAGNOSIS — M79672 Pain in left foot: Secondary | ICD-10-CM | POA: Diagnosis not present

## 2024-07-07 DIAGNOSIS — M79675 Pain in left toe(s): Secondary | ICD-10-CM | POA: Diagnosis not present

## 2024-07-07 DIAGNOSIS — M79671 Pain in right foot: Secondary | ICD-10-CM | POA: Diagnosis not present

## 2024-07-07 DIAGNOSIS — E1140 Type 2 diabetes mellitus with diabetic neuropathy, unspecified: Secondary | ICD-10-CM | POA: Diagnosis not present

## 2024-07-07 DIAGNOSIS — I739 Peripheral vascular disease, unspecified: Secondary | ICD-10-CM | POA: Diagnosis not present

## 2024-07-12 ENCOUNTER — Encounter: Payer: Self-pay | Admitting: Family Medicine

## 2024-07-12 ENCOUNTER — Ambulatory Visit: Admitting: Family Medicine

## 2024-07-12 VITALS — BP 109/62 | HR 96 | Temp 98.5°F | Ht 67.0 in | Wt 181.4 lb

## 2024-07-12 DIAGNOSIS — E785 Hyperlipidemia, unspecified: Secondary | ICD-10-CM | POA: Diagnosis not present

## 2024-07-12 DIAGNOSIS — E1169 Type 2 diabetes mellitus with other specified complication: Secondary | ICD-10-CM

## 2024-07-12 DIAGNOSIS — E1159 Type 2 diabetes mellitus with other circulatory complications: Secondary | ICD-10-CM

## 2024-07-12 DIAGNOSIS — Z0001 Encounter for general adult medical examination with abnormal findings: Secondary | ICD-10-CM

## 2024-07-12 DIAGNOSIS — Z23 Encounter for immunization: Secondary | ICD-10-CM | POA: Diagnosis not present

## 2024-07-12 DIAGNOSIS — E1165 Type 2 diabetes mellitus with hyperglycemia: Secondary | ICD-10-CM | POA: Diagnosis not present

## 2024-07-12 DIAGNOSIS — Z7984 Long term (current) use of oral hypoglycemic drugs: Secondary | ICD-10-CM

## 2024-07-12 DIAGNOSIS — E538 Deficiency of other specified B group vitamins: Secondary | ICD-10-CM

## 2024-07-12 DIAGNOSIS — I152 Hypertension secondary to endocrine disorders: Secondary | ICD-10-CM | POA: Diagnosis not present

## 2024-07-12 DIAGNOSIS — N402 Nodular prostate without lower urinary tract symptoms: Secondary | ICD-10-CM

## 2024-07-12 DIAGNOSIS — I739 Peripheral vascular disease, unspecified: Secondary | ICD-10-CM

## 2024-07-12 DIAGNOSIS — Z0000 Encounter for general adult medical examination without abnormal findings: Secondary | ICD-10-CM

## 2024-07-12 LAB — BAYER DCA HB A1C WAIVED: HB A1C (BAYER DCA - WAIVED): 5.7 % — ABNORMAL HIGH (ref 4.8–5.6)

## 2024-07-12 NOTE — Progress Notes (Signed)
 Complete physical exam  Patient: Dale Terrell   DOB: 10-22-1966   58 y.o. Male  MRN: [id number]  Subjective:    Chief Complaint  Patient presents with   Annual Exam    Dale Terrell is a 58 y.o. male who presents today for a complete physical exam. He reports consuming a general diet. The patient does not participate in regular exercise at present. He generally feels well. He reports sleeping well. He does have additional problems to discuss today.   Intermittent claudication - Occasional leg pain in the calves during extensive walking - Pain described as a tightening sensation - No peripheral edema  Cardiopulmonary symptoms - No chest pain - No shortness of breath  Preventive care - Eye examination completed in May - Dental appointment scheduled for December      Most recent fall risk assessment:    07/12/2024   11:35 AM  Fall Risk   Falls in the past year? 0     Most recent depression screenings:    07/12/2024   11:35 AM 03/29/2024    8:23 AM  PHQ 2/9 Scores  PHQ - 2 Score 1 0  PHQ- 9 Score 1     Vision:Within last year  Past Medical History:  Diagnosis Date   Chronic back pain    Depression    on zoloft    Diabetes mellitus without complication (HCC)    Hyperlipidemia    Hypertension    Seizure (HCC)    as a child, umknown etiology and on no meds ever, only had 1.      Patient Care Team: Joesph Annabella CHRISTELLA, FNP as PCP - General (Family Medicine) Harvey Margo CROME, MD (Inactive) as Consulting Physician (Gastroenterology) Darroll Anes, DO [facility])   Outpatient Medications Prior to Visit  Medication Sig   aspirin  EC 81 MG tablet Take 81 mg by mouth daily.   cyanocobalamin  (VITAMIN B12) 1000 MCG tablet Take 1,000 mcg by mouth daily.   empagliflozin  (JARDIANCE ) 10 MG TABS tablet Take 1 tablet (10 mg total) by mouth daily before breakfast.   famotidine  (PEPCID ) 20 MG tablet TAKE (1) TABLET BY MOUTH 2 TIMES DAILY.   glimepiride  (AMARYL ) 4 MG tablet  TAKE 2 TABLETS BY MOUTH 30 MINUTES BEFORE BREAKFAST DAILY.   glucose blood (ONETOUCH ULTRA) test strip Test BS BID Dx E11.69   lisinopril  (ZESTRIL ) 5 MG tablet TAKE (1) TABLET BY MOUTH ONCE DAILY.   metFORMIN  (GLUCOPHAGE ) 1000 MG tablet Take 1 tablet (1,000 mg total) by mouth 2 (two) times daily with a meal.   sertraline  (ZOLOFT ) 100 MG tablet TAKE (1) TABLET BY MOUTH ONCE DAILY.   simvastatin  (ZOCOR ) 40 MG tablet TAKE ONE TABLET BY MOUTH AT BEDTIME   tiZANidine  (ZANAFLEX ) 4 MG tablet TAKE (1) TABLET BY MOUTH EVERY EIGHT HOURS AS NEEDED.   No facility-administered medications prior to visit.    ROS Negative unless specially indicated above in HPI.     Objective:     BP 109/62   Pulse 96   Temp 98.5 F (36.9 C) (Temporal)   Ht 5' 7 (1.702 m)   Wt 181 lb 6.4 oz (82.3 kg)   SpO2 98%   BMI 28.41 kg/m    Physical Exam Vitals and nursing note reviewed.  Constitutional:      General: He is not in acute distress.    Appearance: He is not ill-appearing.  HENT:     Head: Normocephalic and atraumatic.     Right Ear: Tympanic  membrane, ear canal and external ear normal.     Left Ear: Tympanic membrane, ear canal and external ear normal.     Nose: Nose normal.     Mouth/Throat:     Mouth: Mucous membranes are moist.     Pharynx: Oropharynx is clear.  Eyes:     Extraocular Movements: Extraocular movements intact.     Conjunctiva/sclera: Conjunctivae normal.     Pupils: Pupils are equal, round, and reactive to light.  Cardiovascular:     Rate and Rhythm: Normal rate and regular rhythm.     Pulses: Normal pulses.     Heart sounds: Normal heart sounds. No murmur heard.    No friction rub. No gallop.  Pulmonary:     Effort: Pulmonary effort is normal.     Breath sounds: Normal breath sounds.  Abdominal:     General: Bowel sounds are normal. There is no distension.     Palpations: Abdomen is soft. There is no mass.     Tenderness: There is no abdominal tenderness. There is no  guarding.  Musculoskeletal:     Cervical back: Normal range of motion and neck supple. No tenderness.     Right lower leg: No edema.     Left lower leg: No edema.  Skin:    General: Skin is warm and dry.     Capillary Refill: Capillary refill takes less than 2 seconds.     Findings: No lesion or rash.  Neurological:     General: No focal deficit present.     Mental Status: He is alert and oriented to person, place, and time.     Cranial Nerves: No cranial nerve deficit.     Motor: No weakness.     Coordination: Coordination normal.     Gait: Gait normal.     Deep Tendon Reflexes: Reflexes normal.  Psychiatric:        Mood and Affect: Mood normal.        Behavior: Behavior normal.        Thought Content: Thought content normal.        Judgment: Judgment normal.      No results found for any visits on 07/12/24.     Assessment & Plan:    Routine Health Maintenance and Physical Exam  Dale Terrell was seen today for annual exam.  Diagnoses and all orders for this visit:  Routine general medical examination at a health care facility  Type 2 diabetes mellitus with hyperglycemia, without long-term current use of insulin  (HCC) -     Bayer DCA Hb A1c Waived -     Microalbumin / creatinine urine ratio -     TSH  Hypertension associated with diabetes (HCC) -     CBC with Differential/Platelet -     CMP14+EGFR -     TSH  Hyperlipidemia associated with type 2 diabetes mellitus (HCC) -     Lipid panel  Vitamin B12 deficiency -     Vitamin B12  Prostate nodule -     PSA, total and free  Intermittent claudication (HCC)      Claudication Intermittent claudication during walking suggests possible peripheral vascular disease. -Discussed referral to vascular surgeon for leg blood flow evaluation. He will discuss this with his aunt and let me know.  Type 2 diabetes mellitus  Type 2 diabetes well-controlled with A1c of 5.7. - Continue current diabetes management. - Monitor A1c  regularly.       Immunization History  Administered Date(s)  Administered   INFLUENZA, HIGH DOSE SEASONAL PF 11/09/2019   Influenza Inj Mdck Quad Pf 11/13/2017, 08/15/2021   Influenza Split 07/28/2014   Influenza, Seasonal, Injecte, Preservative Fre 01/01/2024   Influenza,inj,Quad PF,6+ Mos 11/09/2019, 08/03/2020, 09/06/2022   Influenza,inj,quad, With Preservative 08/03/2020   Influenza,trivalent, recombinat, inj, PF 07/28/2014   Influenza-Unspecified 08/03/2020   Moderna Sars-Covid-2 Vaccination 02/18/2020, 03/16/2020   Pneumococcal Polysaccharide-23 10/18/2014, 11/09/2019   Pneumococcal-Unspecified 11/09/2019   Tdap 11/13/2017   Zoster Recombinant(Shingrix ) 11/27/2021, 03/01/2022    Health Maintenance  Topic Date Due   OPHTHALMOLOGY EXAM  03/05/2024   Diabetic kidney evaluation - Urine ACR  03/13/2024   Influenza Vaccine  05/28/2024   Diabetic kidney evaluation - eGFR measurement  06/15/2024   HEMOGLOBIN A1C  07/03/2024   Hepatitis C Screening  12/31/2024 (Originally 04/19/1984)   Pneumococcal Vaccine: 50+ Years (2 of 2 - PCV) 07/12/2025 (Originally 11/08/2020)   Hepatitis B Vaccines 19-59 Average Risk (1 of 3 - 19+ 3-dose series) 07/12/2025 (Originally 04/18/1984)   COVID-19 Vaccine (3 - 2025-26 season) 07/28/2025 (Originally 06/28/2024)   FOOT EXAM  12/31/2024   Medicare Annual Wellness (AWV)  03/29/2025   DTaP/Tdap/Td (2 - Td or Tdap) 11/14/2027   Colonoscopy  01/04/2029   HIV Screening  Completed   Zoster Vaccines- Shingrix   Completed   HPV VACCINES  Aged Out   Meningococcal B Vaccine  Aged Out    Discussed health benefits of physical activity, and encouraged him to engage in regular exercise appropriate for his age and condition.  Problem List Items Addressed This Visit       Cardiovascular and Mediastinum   Hypertension associated with diabetes (HCC)   Relevant Orders   CBC with Differential/Platelet   CMP14+EGFR   TSH     Endocrine   Hyperlipidemia associated  with type 2 diabetes mellitus (HCC)   Relevant Orders   Lipid panel   Type 2 diabetes mellitus with hyperglycemia, without long-term current use of insulin  (HCC)   Relevant Orders   Bayer DCA Hb A1c Waived   Microalbumin / creatinine urine ratio   TSH     Genitourinary   Prostate nodule   Relevant Orders   PSA, total and free     Other   Vitamin B12 deficiency   Relevant Orders   Vitamin B12   Other Visit Diagnoses       Routine general medical examination at a health care facility    -  Primary     Intermittent claudication (HCC)          Return in 6 months (on 01/09/2025) for chronic follow up.   The patient indicates understanding of these issues and agrees with the plan.   Annabella CHRISTELLA Search, FNP

## 2024-07-12 NOTE — Patient Instructions (Signed)
 Health Maintenance, Male  Adopting a healthy lifestyle and getting preventive care are important in promoting health and wellness. Ask your health care provider about:  The right schedule for you to have regular tests and exams.  Things you can do on your own to prevent diseases and keep yourself healthy.  What should I know about diet, weight, and exercise?  Eat a healthy diet    Eat a diet that includes plenty of vegetables, fruits, low-fat dairy products, and lean protein.  Do not eat a lot of foods that are high in solid fats, added sugars, or sodium.  Maintain a healthy weight  Body mass index (BMI) is a measurement that can be used to identify possible weight problems. It estimates body fat based on height and weight. Your health care provider can help determine your BMI and help you achieve or maintain a healthy weight.  Get regular exercise  Get regular exercise. This is one of the most important things you can do for your health. Most adults should:  Exercise for at least 150 minutes each week. The exercise should increase your heart rate and make you sweat (moderate-intensity exercise).  Do strengthening exercises at least twice a week. This is in addition to the moderate-intensity exercise.  Spend less time sitting. Even light physical activity can be beneficial.  Watch cholesterol and blood lipids  Have your blood tested for lipids and cholesterol at 59 years of age, then have this test every 5 years.  You may need to have your cholesterol levels checked more often if:  Your lipid or cholesterol levels are high.  You are older than 59 years of age.  You are at high risk for heart disease.  What should I know about cancer screening?  Many types of cancers can be detected early and may often be prevented. Depending on your health history and family history, you may need to have cancer screening at various ages. This may include screening for:  Colorectal cancer.  Prostate cancer.  Skin cancer.  Lung  cancer.  What should I know about heart disease, diabetes, and high blood pressure?  Blood pressure and heart disease  High blood pressure causes heart disease and increases the risk of stroke. This is more likely to develop in people who have high blood pressure readings or are overweight.  Talk with your health care provider about your target blood pressure readings.  Have your blood pressure checked:  Every 3-5 years if you are 59-59 years of age.  Every year if you are 3 years old or older.  If you are between the ages of 60 and 72 and are a current or former smoker, ask your health care provider if you should have a one-time screening for abdominal aortic aneurysm (AAA).  Diabetes  Have regular diabetes screenings. This checks your fasting blood sugar level. Have the screening done:  Once every three years after age 59 if you are at a normal weight and have a low risk for diabetes.  More often and at a younger age if you are overweight or have a high risk for diabetes.  What should I know about preventing infection?  Hepatitis B  If you have a higher risk for hepatitis B, you should be screened for this virus. Talk with your health care provider to find out if you are at risk for hepatitis B infection.  Hepatitis C  Blood testing is recommended for:  Everyone born from [birth year] through 1966.  Anyone  with known risk factors for hepatitis C.  Sexually transmitted infections (STIs)  You should be screened each year for STIs, including gonorrhea and chlamydia, if:  You are sexually active and are younger than 59 years of age.  You are older than 59 years of age and your health care provider tells you that you are at risk for this type of infection.  Your sexual activity has changed since you were last screened, and you are at increased risk for chlamydia or gonorrhea. Ask your health care provider if you are at risk.  Ask your health care provider about whether you are at high risk for HIV. Your health care provider  may recommend a prescription medicine to help prevent HIV infection. If you choose to take medicine to prevent HIV, you should first get tested for HIV. You should then be tested every 3 months for as long as you are taking the medicine.  Follow these instructions at home:  Alcohol use  Do not drink alcohol if your health care provider tells you not to drink.  If you drink alcohol:  Limit how much you have to 0-2 drinks a day.  Know how much alcohol is in your drink. In the [place] one drink equals one 12 oz bottle of beer (355 mL), one 5 oz glass of wine (148 mL), or one 1 oz glass of hard liquor (44 mL).  Lifestyle  Do not use any products that contain nicotine or tobacco. These products include cigarettes, chewing tobacco, and vaping devices, such as e-cigarettes. If you need help quitting, ask your health care provider.  Do not use street drugs.  Do not share needles.  Ask your health care provider for help if you need support or information about quitting drugs.  General instructions  Schedule regular health, dental, and eye exams.  Stay current with your vaccines.  Tell your health care provider if:  You often feel depressed.  You have ever been abused or do not feel safe at home.  Summary  Adopting a healthy lifestyle and getting preventive care are important in promoting health and wellness.  Follow your health care provider's instructions about healthy diet, exercising, and getting tested or screened for diseases.  Follow your health care provider's instructions on monitoring your cholesterol and blood pressure.  This information is not intended to replace advice given to you by your health care provider. Make sure you discuss any questions you have with your health care provider.  Document Revised: 03/05/2021 Document Reviewed: 03/05/2021  Elsevier Patient Education  2024 [employer].

## 2024-07-13 LAB — TSH: TSH: 2.52 u[IU]/mL (ref 0.450–4.500)

## 2024-07-13 LAB — CMP14+EGFR
ALT: 28 IU/L (ref 0–44)
AST: 33 IU/L (ref 0–40)
Albumin: 4.5 g/dL (ref 3.8–4.9)
Alkaline Phosphatase: 50 IU/L (ref 47–123)
BUN/Creatinine Ratio: 16 (ref 9–20)
BUN: 12 mg/dL (ref 6–24)
Bilirubin Total: 0.3 mg/dL (ref 0.0–1.2)
CO2: 20 mmol/L (ref 20–29)
Calcium: 9.6 mg/dL (ref 8.7–10.2)
Chloride: 100 mmol/L (ref 96–106)
Creatinine, Ser: 0.75 mg/dL — ABNORMAL LOW (ref 0.76–1.27)
Globulin, Total: 3.4 g/dL (ref 1.5–4.5)
Glucose: 55 mg/dL — ABNORMAL LOW (ref 70–99)
Potassium: 4.6 mmol/L (ref 3.5–5.2)
Sodium: 140 mmol/L (ref 134–144)
Total Protein: 7.9 g/dL (ref 6.0–8.5)
eGFR: 104 mL/min/1.73 (ref >59)

## 2024-07-13 LAB — PSA, TOTAL AND FREE
PSA, Free Pct: 41.7 %
PSA, Free: 0.25 ng/mL
Prostate Specific Ag, Serum: 0.6 ng/mL (ref 0.0–4.0)

## 2024-07-13 LAB — CBC WITH DIFFERENTIAL/PLATELET
Basophils Absolute: 0.1 x10E3/uL (ref 0.0–0.2)
Basos: 1 %
EOS (ABSOLUTE): 0.1 x10E3/uL (ref 0.0–0.4)
Eos: 1 %
Hematocrit: 45.3 % (ref 37.5–51.0)
Hemoglobin: 14.4 g/dL (ref 13.0–17.7)
Immature Grans (Abs): 0 x10E3/uL (ref 0.0–0.1)
Immature Granulocytes: 0 %
Lymphocytes Absolute: 1.9 x10E3/uL (ref 0.7–3.1)
Lymphs: 20 %
MCH: 27.5 pg (ref 26.6–33.0)
MCHC: 31.8 g/dL (ref 31.5–35.7)
MCV: 87 fL (ref 79–97)
Monocytes Absolute: 0.8 x10E3/uL (ref 0.1–0.9)
Monocytes: 9 %
Neutrophils Absolute: 6.5 x10E3/uL (ref 1.4–7.0)
Neutrophils: 69 %
Platelets: 307 x10E3/uL (ref 150–450)
RBC: 5.24 x10E6/uL (ref 4.14–5.80)
RDW: 14.3 % (ref 11.6–15.4)
WBC: 9.3 x10E3/uL (ref 3.4–10.8)

## 2024-07-13 LAB — LIPID PANEL
Chol/HDL Ratio: 4 ratio (ref 0.0–5.0)
Cholesterol, Total: 135 mg/dL (ref 100–199)
HDL: 34 mg/dL — ABNORMAL LOW (ref >39)
LDL Chol Calc (NIH): 81 mg/dL (ref 0–99)
Triglycerides: 111 mg/dL (ref 0–149)
VLDL Cholesterol Cal: 20 mg/dL (ref 5–40)

## 2024-07-13 LAB — MICROALBUMIN / CREATININE URINE RATIO
Creatinine, Urine: 114.3 mg/dL
Microalb/Creat Ratio: 23 mg/g{creat} (ref 0–29)
Microalbumin, Urine: 26.1 ug/mL

## 2024-07-13 LAB — VITAMIN B12: Vitamin B-12: 235 pg/mL (ref 232–1245)

## 2024-07-14 ENCOUNTER — Ambulatory Visit: Payer: Self-pay | Admitting: Family Medicine

## 2024-07-25 ENCOUNTER — Other Ambulatory Visit: Payer: Self-pay | Admitting: Family Medicine

## 2024-07-25 DIAGNOSIS — E1165 Type 2 diabetes mellitus with hyperglycemia: Secondary | ICD-10-CM

## 2024-07-30 ENCOUNTER — Ambulatory Visit: Payer: Self-pay | Admitting: *Deleted

## 2024-07-30 NOTE — Telephone Encounter (Signed)
 Copied from CRM [id number]. Topic: Clinical - Medication Question >> Jul 30, 2024  9:02 AM Dale Terrell wrote: Reason for CRM:  Dale Terrell wants to know if FNP Joesph will call him in some cough medication, He only has a cough and runny nose. Please call medication into [place] and pharmacy closes at 6 PM today. Also, please call Mohan with any questions at [phone]  Attempted to contact patient regarding symptoms and medication request- no answer and VM not set up message. Unable to leave call back message.

## 2024-07-30 NOTE — Telephone Encounter (Signed)
 FYI Only or Action Required?: Action required by provider: Requesting medication for a cough.  Patient was last seen in primary care on 07/12/2024 by Joesph Annabella HERO, FNP.  Called Nurse Triage reporting Cough.  Symptoms began several days ago.  Interventions attempted: OTC medications: Nyquil.  Symptoms are: gradually worsening.  Triage Disposition: See PCP When Office is Open (Within 3 Days)  Patient/caregiver understands and will follow disposition?: Yes      Reason for Disposition  [1] Nasal discharge AND [2] present > 10 days    Nasal discharge for 2 days.  Answer Assessment - Initial Assessment Questions Using Nyquil cough meds for symptoms. Patient requesting medication to be sent in for his symptoms. [facility] is his preferred pharmacy.   1. ONSET: When did the cough begin?      2 days  2. SEVERITY: How bad is the cough today?      Mild to Moderate  3. SPUTUM: Describe the color of your sputum (e.g., none, dry cough; clear, white, yellow, green)     Dark green/ Yellow in color  4. HEMOPTYSIS: Are you coughing up any blood? If Yes, ask: How much? (e.g., flecks, streaks, tablespoons, etc.)     Denies  5. DIFFICULTY BREATHING: Are you having difficulty breathing? If Yes, ask: How bad is it? (e.g., mild, moderate, severe)      Denies  6. FEVER: Do you have a fever? If Yes, ask: What is your temperature, how was it measured, and when did it start?     Denies  7 OTHER SYMPTOMS: Do you have any other symptoms? (e.g., runny nose, wheezing, chest pain)       Runny nose, headache  Protocols used: Cough - Acute Productive-A-AH

## 2024-07-30 NOTE — Telephone Encounter (Signed)
 Appt made.

## 2024-08-02 ENCOUNTER — Ambulatory Visit (INDEPENDENT_AMBULATORY_CARE_PROVIDER_SITE_OTHER): Admitting: Family

## 2024-08-02 ENCOUNTER — Encounter: Payer: Self-pay | Admitting: Family

## 2024-08-02 VITALS — BP 122/70 | HR 68 | Temp 97.8°F | Ht 67.0 in | Wt 184.2 lb

## 2024-08-02 DIAGNOSIS — B9689 Other specified bacterial agents as the cause of diseases classified elsewhere: Secondary | ICD-10-CM

## 2024-08-02 DIAGNOSIS — J208 Acute bronchitis due to other specified organisms: Secondary | ICD-10-CM | POA: Diagnosis not present

## 2024-08-02 MED ORDER — AZITHROMYCIN 250 MG PO TABS: ORAL_TABLET | ORAL | 0 refills | Status: AC

## 2024-08-02 MED ORDER — BENZONATATE 200 MG PO CAPS: 200.0000 mg | ORAL_CAPSULE | Freq: Three times a day (TID) | ORAL | 1 refills | Status: AC | PRN

## 2024-08-02 MED ORDER — PROMETHAZINE-DM 6.25-15 MG/5ML PO SYRP: 5.0000 mL | ORAL_SOLUTION | Freq: Three times a day (TID) | ORAL | 0 refills | Status: AC | PRN

## 2024-08-02 NOTE — Progress Notes (Signed)
 Subjective:    Patient ID: Lynwood CHRISTELLA Hasty, male    DOB: [birth year], 58 y.o.   MRN: [id number]  Chief Complaint  Patient presents with   Cough   Nasal Congestion   PT presents to the office today with a cough for over a week.  Cough This is a new problem. The current episode started 1 to 4 weeks ago. The problem has been waxing and waning. The problem occurs constantly. The cough is Productive of brown sputum. Associated symptoms include nasal congestion and rhinorrhea. Pertinent negatives include no chills, ear congestion, ear pain, fever, headaches, myalgias, shortness of breath or wheezing. He has tried OTC cough suppressant and rest for the symptoms. The treatment provided mild relief.      Review of Systems  Constitutional:  Negative for chills and fever.  HENT:  Positive for rhinorrhea. Negative for ear pain.   Respiratory:  Positive for cough. Negative for shortness of breath and wheezing.   Musculoskeletal:  Negative for myalgias.  Neurological:  Negative for headaches.  All other systems reviewed and are negative.   Social History   Socioeconomic History   Marital status: Single    Spouse name: Not on file   Number of children: 1   Years of education: 12   Highest education level: High school graduate  Occupational History   Occupation: disabled  Tobacco Use   Smoking status: Former    Current packs/day: 0.00    Average packs/day: 0.3 packs/day for 2.0 years (0.5 ttl pk-yrs)    Types: Cigarettes    Start date: 2019    Quit date: 2021    Years since quitting: 4.7   Smokeless tobacco: Never  Vaping Use   Vaping status: Never Used  Substance and Sexual Activity   Alcohol use: Not Currently   Drug use: No   Sexual activity: Not Currently    Birth control/protection: None  Other Topics Concern   Not on file  Social History Narrative   Lives with his aunt and brother   Social Drivers of Health   Financial Resource Strain: Low Risk  (03/29/2024)   Overall  Financial Resource Strain (CARDIA)    Difficulty of Paying Living Expenses: Not hard at all  Food Insecurity: No Food Insecurity (03/29/2024)   Hunger Vital Sign    Worried About Running Out of Food in the Last Year: Never true    Ran Out of Food in the Last Year: Never true  Transportation Needs: No Transportation Needs (03/29/2024)   PRAPARE - Administrator, Civil Service (Medical): No    Lack of Transportation (Non-Medical): No  Physical Activity: Insufficiently Active (03/29/2024)   Exercise Vital Sign    Days of Exercise per Week: 7 days    Minutes of Exercise per Session: 20 min  Stress: No Stress Concern Present (03/29/2024)   [employer] of Occupational Health - Occupational Stress Questionnaire    Feeling of Stress : Not at all  Social Connections: Socially Isolated (03/29/2024)   Social Connection and Isolation Panel    Frequency of Communication with Friends and Family: More than three times a week    Frequency of Social Gatherings with Friends and Family: More than three times a week    Attends Religious Services: Never    Database administrator or Organizations: No    Attends Banker Meetings: Never    Marital Status: Never married   Family History  Problem Relation Age of Onset  Stroke Mother    Arthritis Mother    Heart attack Father        30s   Hearing loss Maternal Grandmother    Colon cancer Neg Hx    Colon polyps Neg Hx         Objective:   Physical Exam Vitals reviewed.  Constitutional:      General: He is not in acute distress.    Appearance: He is well-developed.  HENT:     Head: Normocephalic.     Right Ear: Tympanic membrane normal.     Left Ear: Tympanic membrane normal.  Eyes:     General:        Right eye: No discharge.        Left eye: No discharge.     Pupils: Pupils are equal, round, and reactive to light.  Neck:     Thyroid : No thyromegaly.  Cardiovascular:     Rate and Rhythm: Normal rate and regular  rhythm.     Heart sounds: Normal heart sounds. No murmur heard. Pulmonary:     Effort: Pulmonary effort is normal. No respiratory distress.     Breath sounds: Normal breath sounds. No wheezing.  Abdominal:     General: Bowel sounds are normal. There is no distension.     Palpations: Abdomen is soft.     Tenderness: There is no abdominal tenderness.  Musculoskeletal:        General: No tenderness. Normal range of motion.     Cervical back: Normal range of motion and neck supple.  Skin:    General: Skin is warm and dry.     Findings: No erythema or rash.  Neurological:     Mental Status: He is alert and oriented to person, place, and time.     Cranial Nerves: No cranial nerve deficit.     Deep Tendon Reflexes: Reflexes are normal and symmetric.  Psychiatric:        Behavior: Behavior normal.        Thought Content: Thought content normal.        Judgment: Judgment normal.       BP 122/70   Pulse 68   Temp 97.8 F (36.6 C) (Temporal)   Ht 5' 7 (1.702 m)   Wt 184 lb 3.2 oz (83.6 kg)   SpO2 100%   BMI 28.85 kg/m      Assessment & Plan:  Dale Terrell comes in today with chief complaint of Cough and Nasal Congestion   Diagnosis and orders addressed:  1. Acute bacterial bronchitis (Primary) - Take meds as prescribed - Use a cool mist humidifier  -Use saline nose sprays frequently -Force fluids -For any cough or congestion  Use plain Mucinex- regular strength or max strength is fine -For fever or aces or pains- take tylenol  or ibuprofen . -Throat lozenges if help -Follow up if symptoms worsen or do not improve  - azithromycin (ZITHROMAX) 250 MG tablet; Take 500 mg once, then 250 mg for four days  Dispense: 6 tablet; Refill: 0 - benzonatate  (TESSALON ) 200 MG capsule; Take 1 capsule (200 mg total) by mouth 3 (three) times daily as needed.  Dispense: 30 capsule; Refill: 1 - promethazine -dextromethorphan (PROMETHAZINE -DM) 6.25-15 MG/5ML syrup; Take 5 mLs by mouth 3  (three) times daily as needed for cough.  Dispense: 118 mL; Refill: 0      Bari Learn, FNP

## 2024-08-02 NOTE — Patient Instructions (Signed)

## 2024-08-30 ENCOUNTER — Other Ambulatory Visit: Payer: Self-pay | Admitting: Family Medicine

## 2024-08-30 DIAGNOSIS — E1165 Type 2 diabetes mellitus with hyperglycemia: Secondary | ICD-10-CM

## 2024-09-17 ENCOUNTER — Other Ambulatory Visit: Payer: Self-pay | Admitting: Family Medicine

## 2024-09-17 DIAGNOSIS — E1169 Type 2 diabetes mellitus with other specified complication: Secondary | ICD-10-CM

## 2024-09-17 DIAGNOSIS — K219 Gastro-esophageal reflux disease without esophagitis: Secondary | ICD-10-CM

## 2024-09-28 ENCOUNTER — Encounter: Payer: Self-pay | Admitting: *Deleted

## 2024-10-01 ENCOUNTER — Other Ambulatory Visit: Payer: Self-pay | Admitting: Family Medicine

## 2024-10-01 DIAGNOSIS — E1169 Type 2 diabetes mellitus with other specified complication: Secondary | ICD-10-CM

## 2024-11-30 ENCOUNTER — Other Ambulatory Visit: Payer: Self-pay | Admitting: Family Medicine

## 2024-11-30 DIAGNOSIS — F339 Major depressive disorder, recurrent, unspecified: Secondary | ICD-10-CM

## 2025-01-10 ENCOUNTER — Ambulatory Visit: Payer: Self-pay | Admitting: Family Medicine

## 2025-01-13 ENCOUNTER — Ambulatory Visit: Admitting: Family Medicine

## 2025-04-04 ENCOUNTER — Encounter
